# Patient Record
Sex: Female | Born: 1995 | Race: White | Hispanic: No | Marital: Married | Smoking: Never smoker
Health system: Southern US, Community
[De-identification: ages and names within clinical notes are randomized; demographics above are authoritative.]

## PROBLEM LIST (undated history)

## (undated) DIAGNOSIS — F605 Obsessive-compulsive personality disorder: Secondary | ICD-10-CM

## (undated) DIAGNOSIS — F32A Depression, unspecified: Secondary | ICD-10-CM

## (undated) DIAGNOSIS — M797 Fibromyalgia: Secondary | ICD-10-CM

## (undated) DIAGNOSIS — K219 Gastro-esophageal reflux disease without esophagitis: Secondary | ICD-10-CM

## (undated) DIAGNOSIS — F419 Anxiety disorder, unspecified: Secondary | ICD-10-CM

## (undated) DIAGNOSIS — F429 Obsessive-compulsive disorder, unspecified: Secondary | ICD-10-CM

## (undated) HISTORY — DX: Gastro-esophageal reflux disease without esophagitis: K21.9

## (undated) HISTORY — DX: Fibromyalgia: M79.7

## (undated) HISTORY — PX: FOOT BONE EXCISION: SUR493

---
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
## 2016-03-18 DIAGNOSIS — B309 Viral conjunctivitis, unspecified: Secondary | ICD-10-CM | POA: Insufficient documentation

## 2021-09-11 ENCOUNTER — Ambulatory Visit: Payer: Self-pay | Admitting: Family

## 2021-10-05 ENCOUNTER — Ambulatory Visit: Payer: 59 | Admitting: Family

## 2021-10-05 DIAGNOSIS — R102 Pelvic and perineal pain: Secondary | ICD-10-CM | POA: Diagnosis not present

## 2021-10-05 DIAGNOSIS — N979 Female infertility, unspecified: Secondary | ICD-10-CM | POA: Diagnosis not present

## 2021-10-05 DIAGNOSIS — N946 Dysmenorrhea, unspecified: Secondary | ICD-10-CM | POA: Diagnosis not present

## 2021-10-05 DIAGNOSIS — N941 Unspecified dyspareunia: Secondary | ICD-10-CM | POA: Diagnosis not present

## 2021-11-03 DIAGNOSIS — Z6835 Body mass index (BMI) 35.0-35.9, adult: Secondary | ICD-10-CM | POA: Diagnosis not present

## 2021-11-03 DIAGNOSIS — M549 Dorsalgia, unspecified: Secondary | ICD-10-CM | POA: Diagnosis not present

## 2021-11-04 ENCOUNTER — Ambulatory Visit: Payer: 59 | Admitting: Family

## 2021-11-06 DIAGNOSIS — M546 Pain in thoracic spine: Secondary | ICD-10-CM | POA: Diagnosis not present

## 2021-11-06 DIAGNOSIS — G8929 Other chronic pain: Secondary | ICD-10-CM | POA: Diagnosis not present

## 2021-11-06 DIAGNOSIS — R69 Illness, unspecified: Secondary | ICD-10-CM | POA: Diagnosis not present

## 2021-11-06 DIAGNOSIS — R0781 Pleurodynia: Secondary | ICD-10-CM | POA: Diagnosis not present

## 2021-11-10 ENCOUNTER — Other Ambulatory Visit: Payer: Self-pay | Admitting: Internal Medicine

## 2021-11-10 ENCOUNTER — Ambulatory Visit
Admission: RE | Admit: 2021-11-10 | Discharge: 2021-11-10 | Disposition: A | Discharge status: Home / Self Care | Payer: 59 | Source: Ambulatory Visit | Attending: Internal Medicine | Admitting: Internal Medicine

## 2021-11-10 DIAGNOSIS — R0781 Pleurodynia: Secondary | ICD-10-CM

## 2021-11-10 DIAGNOSIS — R059 Cough, unspecified: Secondary | ICD-10-CM | POA: Diagnosis not present

## 2021-11-10 DIAGNOSIS — R079 Chest pain, unspecified: Secondary | ICD-10-CM | POA: Diagnosis not present

## 2021-11-16 ENCOUNTER — Other Ambulatory Visit: Payer: Self-pay | Admitting: Obstetrics and Gynecology

## 2021-11-16 DIAGNOSIS — N979 Female infertility, unspecified: Secondary | ICD-10-CM

## 2021-11-17 ENCOUNTER — Emergency Department (HOSPITAL_COMMUNITY): Payer: 59

## 2021-11-17 ENCOUNTER — Encounter (HOSPITAL_COMMUNITY): Payer: Self-pay

## 2021-11-17 ENCOUNTER — Emergency Department (HOSPITAL_COMMUNITY)
Admission: EM | Admit: 2021-11-17 | Discharge: 2021-11-17 | Disposition: A | Discharge status: Home / Self Care | Payer: 59 | Attending: Emergency Medicine | Admitting: Emergency Medicine

## 2021-11-17 ENCOUNTER — Other Ambulatory Visit: Payer: Self-pay

## 2021-11-17 DIAGNOSIS — Z20822 Contact with and (suspected) exposure to covid-19: Secondary | ICD-10-CM | POA: Diagnosis not present

## 2021-11-17 DIAGNOSIS — R1012 Left upper quadrant pain: Secondary | ICD-10-CM | POA: Insufficient documentation

## 2021-11-17 DIAGNOSIS — D72829 Elevated white blood cell count, unspecified: Secondary | ICD-10-CM | POA: Insufficient documentation

## 2021-11-17 DIAGNOSIS — S2232XA Fracture of one rib, left side, initial encounter for closed fracture: Secondary | ICD-10-CM | POA: Diagnosis not present

## 2021-11-17 DIAGNOSIS — X58XXXA Exposure to other specified factors, initial encounter: Secondary | ICD-10-CM | POA: Diagnosis not present

## 2021-11-17 DIAGNOSIS — S29001A Unspecified injury of muscle and tendon of front wall of thorax, initial encounter: Secondary | ICD-10-CM | POA: Diagnosis not present

## 2021-11-17 DIAGNOSIS — R0781 Pleurodynia: Secondary | ICD-10-CM | POA: Diagnosis not present

## 2021-11-17 DIAGNOSIS — R059 Cough, unspecified: Secondary | ICD-10-CM | POA: Diagnosis not present

## 2021-11-17 DIAGNOSIS — R0602 Shortness of breath: Secondary | ICD-10-CM | POA: Diagnosis not present

## 2021-11-17 DIAGNOSIS — R079 Chest pain, unspecified: Secondary | ICD-10-CM | POA: Diagnosis not present

## 2021-11-17 DIAGNOSIS — R109 Unspecified abdominal pain: Secondary | ICD-10-CM | POA: Diagnosis not present

## 2021-11-17 DIAGNOSIS — R0789 Other chest pain: Secondary | ICD-10-CM | POA: Diagnosis not present

## 2021-11-17 DIAGNOSIS — R000 Tachycardia, unspecified: Secondary | ICD-10-CM | POA: Diagnosis not present

## 2021-11-17 HISTORY — DX: Anxiety disorder, unspecified: F41.9

## 2021-11-17 HISTORY — DX: Depression, unspecified: F32.A

## 2021-11-17 HISTORY — DX: Obsessive-compulsive disorder, unspecified: F42.9

## 2021-11-17 HISTORY — DX: Obsessive-compulsive personality disorder: F60.5

## 2021-11-17 LAB — CBC
HCT: 43.6 % (ref 36.0–46.0)
Hemoglobin: 15.6 g/dL — ABNORMAL HIGH (ref 12.0–15.0)
MCH: 32.7 pg (ref 26.0–34.0)
MCHC: 35.8 g/dL (ref 30.0–36.0)
MCV: 91.4 fL (ref 80.0–100.0)
Platelets: 484 10*3/uL — ABNORMAL HIGH (ref 150–400)
RBC: 4.77 MIL/uL (ref 3.87–5.11)
RDW: 11.5 % (ref 11.5–15.5)
WBC: 15.9 10*3/uL — ABNORMAL HIGH (ref 4.0–10.5)
nRBC: 0 % (ref 0.0–0.2)

## 2021-11-17 LAB — URINALYSIS, ROUTINE W REFLEX MICROSCOPIC
Bilirubin Urine: NEGATIVE
Glucose, UA: NEGATIVE mg/dL
Ketones, ur: 5 mg/dL — AB
Leukocytes,Ua: NEGATIVE
Nitrite: NEGATIVE
Protein, ur: 30 mg/dL — AB
Specific Gravity, Urine: 1.025 (ref 1.005–1.030)
pH: 5 (ref 5.0–8.0)

## 2021-11-17 LAB — TROPONIN I (HIGH SENSITIVITY)
Troponin I (High Sensitivity): 3 ng/L (ref <18)
Troponin I (High Sensitivity): 4 ng/L (ref <18)

## 2021-11-17 LAB — LIPASE, BLOOD: Lipase: 35 U/L (ref 11–51)

## 2021-11-17 LAB — HEPATIC FUNCTION PANEL
ALT: 19 U/L (ref 0–44)
AST: 18 U/L (ref 15–41)
Albumin: 3.8 g/dL (ref 3.5–5.0)
Alkaline Phosphatase: 83 U/L (ref 38–126)
Bilirubin, Direct: <0.1 mg/dL (ref 0.0–0.2)
Total Bilirubin: 0.7 mg/dL (ref 0.3–1.2)
Total Protein: 6.6 g/dL (ref 6.5–8.1)

## 2021-11-17 LAB — I-STAT BETA HCG BLOOD, ED (MC, WL, AP ONLY): I-stat hCG, quantitative: <5 m[IU]/mL (ref <5)

## 2021-11-17 LAB — BASIC METABOLIC PANEL
Anion gap: 12 (ref 5–15)
BUN: 9 mg/dL (ref 6–20)
CO2: 22 mmol/L (ref 22–32)
Calcium: 9.1 mg/dL (ref 8.9–10.3)
Chloride: 104 mmol/L (ref 98–111)
Creatinine, Ser: 0.73 mg/dL (ref 0.44–1.00)
GFR, Estimated: >60 mL/min (ref >60)
Glucose, Bld: 134 mg/dL — ABNORMAL HIGH (ref 70–99)
Potassium: 3.7 mmol/L (ref 3.5–5.1)
Sodium: 138 mmol/L (ref 135–145)

## 2021-11-17 LAB — RESP PANEL BY RT-PCR (FLU A&B, COVID) ARPGX2
Influenza A by PCR: NEGATIVE
Influenza B by PCR: NEGATIVE
SARS Coronavirus 2 by RT PCR: NEGATIVE

## 2021-11-17 MED ORDER — MORPHINE SULFATE (PF) 4 MG/ML IV SOLN
4.0000 mg | Freq: Once | INTRAVENOUS | Status: AC
Start: 2021-11-17 — End: 2021-11-17
  Administered 2021-11-17: 4 mg via INTRAVENOUS
  Filled 2021-11-17: qty 1

## 2021-11-17 MED ORDER — OXYCODONE-ACETAMINOPHEN 5-325 MG PO TABS
1.0000 | ORAL_TABLET | Freq: Once | ORAL | Status: AC
  Administered 2021-11-17: 1 via ORAL
  Filled 2021-11-17: qty 1

## 2021-11-17 MED ORDER — IOHEXOL 350 MG/ML SOLN
100.0000 mL | Freq: Once | INTRAVENOUS | Status: AC | PRN
  Administered 2021-11-17: 100 mL via INTRAVENOUS

## 2021-11-17 MED ORDER — OXYCODONE-ACETAMINOPHEN 5-325 MG PO TABS: 1.0000 | ORAL_TABLET | ORAL | 0 refills | Status: DC | PRN

## 2021-11-17 MED ORDER — LIDOCAINE 5 % EX PTCH: 1.0000 | MEDICATED_PATCH | CUTANEOUS | 0 refills | Status: DC

## 2021-11-17 NOTE — ED Provider Triage Note (Signed)
Emergency Medicine Provider Triage Evaluation Note ? ?Jessica Mcclain , a 26 y.o. female  was evaluated in triage.  Pt complains of left rib pain x 1 month, worsened suddenly with popping sensation while coughing. SOB, pain with deep inspiration.  ? ?Review of Systems  ?Positive: Rib pain shortness of breath urinary frequency and dysuria ?Negative: Chest pain, nausea, vomiting ? ?Physical Exam  ?BP (!) 174/117 (BP Location: Right Arm)   Pulse (!) 143   Temp 99 ?F (37.2 ?C) (Oral)   Resp (!) 24   Ht 5\' 7"  (1.702 m)   Wt 90.4 kg   SpO2 96%   BMI 31.21 kg/m?  ?Gen:   Awake, no distress   ?Resp:  Normal effort  ?MSK:   Moves extremities without difficulty  ?Other:  Cardiopulmonary exam revealed tachycardia to 108 heart rate with regular rhythm.  Pulmonary exam is unremarkable.  Patient does appear somewhat anxious.  Neurovascular intact in all 4 extremities.  Tenderness palpation over the left ribs. ? ?Medical Decision Making  ?Medically screening exam initiated at 5:10 PM.  Appropriate orders placed.  Jessica Mcclain was informed that the remainder of the evaluation will be completed by another provider, this initial triage assessment does not replace that evaluation, and the importance of remaining in the ED until their evaluation is complete. ? ?This chart was dictated using voice recognition software, Dragon. Despite the best efforts of this provider to proofread and correct errors, errors may still occur which can change documentation meaning. ? ? ?  ?Emeline Darling, PA-C ?11/17/21 1717 ? ?

## 2021-11-17 NOTE — ED Triage Notes (Signed)
Pt arrived via POV for rib pain on left [id number]. Pt states she felt a popping on her left rib area and is now having SOB about 30 mins ago. Pt is tachypneic, tachycardic. Pt is dyspneic. ?

## 2021-11-17 NOTE — Discharge Instructions (Signed)
Your history, exam, work-up today revealed a single left-sided rib fracture causing your symptoms.  There is no evidence of collapsed lung, pneumonia, or blood clot on the imaging.  Your heart enzymes were reassuring.  We feel you are safe for discharge home.  Please use the incentive spirometer as we discussed and use the pain medicine and Lidoderm patches to work with your muscle relaxant.  Please follow-up with your primary doctor and rest and stay hydrated.  If any symptoms change or worsen acutely, please return to the nearest emergency department. ?

## 2021-11-17 NOTE — ED Provider Notes (Signed)
[facility] EMERGENCY DEPARTMENT Provider Note   CSN: [id number] Arrival date & time: 11/17/21  1652     History  Chief Complaint  Patient presents with   Chest Pain    Jessica Mcclain is a 26 y.o. female.  The history is provided by the patient and medical records. No language interpreter was used.  Chest Pain Pain location:  L lateral chest Pain quality: sharp   Radiates to: back, LUQ. Pain severity:  Severe Onset quality:  Sudden Duration:  4 hours Timing:  Constant Progression:  Waxing and waning Chronicity:  Recurrent Context: breathing   Worsened by:  Coughing and deep breathing Ineffective treatments: muscle relasant. Associated symptoms: abdominal pain, back pain, cough and shortness of breath   Associated symptoms: no altered mental status, no diaphoresis, no fatigue, no fever, no headache, no lower extremity edema, no nausea, no near-syncope, no numbness, no palpitations, no syncope, no vomiting and no weakness   Risk factors: not pregnant and no prior DVT/PE       Home Medications Prior to Admission medications   Medication Sig Start Date End Date Taking? Authorizing Provider  Levonorgestrel-Ethinyl Estradiol (CAMRESE LO) 0.1-0.02 & 0.01 MG tablet Take 1 tablet by mouth at bedtime.    [provider]      Allergies    Patient has no known allergies.    Review of Systems   Review of Systems  Constitutional:  Negative for chills, diaphoresis, fatigue and fever.  HENT:  Negative for congestion.   Eyes:  Negative for visual disturbance.  Respiratory:  Positive for cough and shortness of breath. Negative for chest tightness and wheezing.   Cardiovascular:  Positive for chest pain. Negative for palpitations, leg swelling, syncope and near-syncope.  Gastrointestinal:  Positive for abdominal pain. Negative for constipation, diarrhea, nausea and vomiting.  Genitourinary:  Negative for dysuria.  Musculoskeletal:  Positive for  back pain. Negative for neck pain and neck stiffness.  Skin:  Negative for rash and wound.  Neurological:  Negative for weakness, light-headedness, numbness and headaches.  Psychiatric/Behavioral:  Negative for agitation and confusion.   All other systems reviewed and are negative.  Physical Exam Updated Vital Signs BP (!) 141/85 (BP Location: Right Arm)   Pulse (!) 122   Temp 99 F (37.2 C) (Oral)   Resp 20   Ht 5\' 7"  (1.702 m)   Wt 90.4 kg   SpO2 100%   BMI 31.21 kg/m  Physical Exam Vitals and nursing note reviewed.  Constitutional:      General: She is not in acute distress.    Appearance: She is well-developed. She is not ill-appearing, toxic-appearing or diaphoretic.  HENT:     Head: Normocephalic and atraumatic.  Eyes:     Conjunctiva/sclera: Conjunctivae normal.  Cardiovascular:     Rate and Rhythm: Regular rhythm. Tachycardia present.     Heart sounds: Normal heart sounds. No murmur heard. Pulmonary:     Effort: Pulmonary effort is normal. Tachypnea present. No respiratory distress.     Breath sounds: Normal breath sounds. No decreased breath sounds, wheezing, rhonchi or rales.  Chest:     Chest wall: Tenderness present.  Abdominal:     Palpations: Abdomen is soft.     Tenderness: There is abdominal tenderness. There is no guarding or rebound.  Musculoskeletal:        General: No swelling.     Cervical back: Neck supple.     Right lower leg: No tenderness. No edema.  Left lower leg: No tenderness. No edema.  Skin:    General: Skin is warm and dry.     Capillary Refill: Capillary refill takes less than 2 seconds.  Neurological:     General: No focal deficit present.     Mental Status: She is alert.  Psychiatric:        Mood and Affect: Mood normal.    ED Results / Procedures / Treatments   Labs (all labs ordered are listed, but only abnormal results are displayed) Labs Reviewed  BASIC METABOLIC PANEL - Abnormal; Notable for the following components:       Result Value   Glucose, Bld 134 (*)    All other components within normal limits  CBC - Abnormal; Notable for the following components:   WBC 15.9 (*)    Hemoglobin 15.6 (*)    Platelets 484 (*)    All other components within normal limits  URINALYSIS, ROUTINE W REFLEX MICROSCOPIC - Abnormal; Notable for the following components:   Color, Urine AMBER (*)    APPearance HAZY (*)    Hgb urine dipstick LARGE (*)    Ketones, ur 5 (*)    Protein, ur 30 (*)    Bacteria, UA RARE (*)    All other components within normal limits  RESP PANEL BY RT-PCR (FLU A&B, COVID) ARPGX2  LIPASE, BLOOD  HEPATIC FUNCTION PANEL  I-STAT BETA HCG BLOOD, ED ([facility], [facility], [facility] ONLY)  TROPONIN I (HIGH SENSITIVITY)  TROPONIN I (HIGH SENSITIVITY)    EKG None  Radiology DG Ribs Unilateral W/Chest Left  Result Date: 11/17/2021 CLINICAL DATA:  Shortness of breath with left rib pain. EXAM: LEFT RIBS AND CHEST - 3+ VIEW COMPARISON:  Chest x-ray 11/10/2021. FINDINGS: No fracture or other bone lesions are seen involving the ribs. There is no evidence of pneumothorax or pleural effusion. Both lungs are clear. Heart size and mediastinal contours are within normal limits. IMPRESSION: Negative. Electronically Signed   By: Darliss Cheney M.D.   On: 11/17/2021 17:46   CT Angio Chest PE W and/or Wo Contrast  Result Date: 11/17/2021 CLINICAL DATA:  Left chest, back, and upper abdominal pain. Tachycardia. Shortness of breath. EXAM: CT ANGIOGRAPHY CHEST WITH CONTRAST TECHNIQUE: Multidetector CT imaging of the chest was performed using the standard protocol during bolus administration of intravenous contrast. Multiplanar CT image reconstructions and MIPs were obtained to evaluate the vascular anatomy. RADIATION DOSE REDUCTION: This exam was performed according to the departmental dose-optimization program which includes automated exposure control, adjustment of the mA and/or kV according to patient size and/or use of iterative  reconstruction technique. CONTRAST:  OMNIPAQUE IOHEXOL 350 MG/ML SOLN COMPARISON:  11/17/2021 FINDINGS: Cardiovascular: This is a technically adequate evaluation of the pulmonary vasculature. No filling defects or pulmonary emboli. The heart is unremarkable without pericardial effusion. No evidence of thoracic aortic aneurysm or dissection. Mediastinum/Nodes: No enlarged mediastinal, hilar, or axillary lymph nodes. Thyroid gland, trachea, and esophagus demonstrate no significant findings. Lungs/Pleura: No acute airspace disease, effusion, or pneumothorax. Central airways are widely patent. Upper Abdomen: No acute abnormality. Musculoskeletal: No acute or destructive bony lesions. Reconstructed images demonstrate no additional findings. Review of the MIP images confirms the above findings. IMPRESSION: 1. No evidence of pulmonary embolus. 2. No acute intrathoracic process. Electronically Signed   By: Sharlet Salina M.D.   On: 11/17/2021 21:12   CT ABDOMEN PELVIS W CONTRAST  Result Date: 11/17/2021 CLINICAL DATA:  Left upper quadrant pain, tachycardia, shortness of breath EXAM: CT  ABDOMEN AND PELVIS WITH CONTRAST TECHNIQUE: Multidetector CT imaging of the abdomen and pelvis was performed using the standard protocol following bolus administration of intravenous contrast. RADIATION DOSE REDUCTION: This exam was performed according to the departmental dose-optimization program which includes automated exposure control, adjustment of the mA and/or kV according to patient size and/or use of iterative reconstruction technique. CONTRAST:  OMNIPAQUE IOHEXOL 350 MG/ML SOLN COMPARISON:  None. FINDINGS: Lower chest: No acute pleural or parenchymal lung disease. Hepatobiliary: No focal liver abnormality is seen. No gallstones, gallbladder wall thickening, or biliary dilatation. Pancreas: Unremarkable. No pancreatic ductal dilatation or surrounding inflammatory changes. Spleen: Normal in size without focal  abnormality. Adrenals/Urinary Tract: Adrenal glands are unremarkable. Kidneys are normal, without renal calculi, focal lesion, or hydronephrosis. Bladder is unremarkable. Stomach/Bowel: No bowel obstruction or ileus. Normal appendix right lower quadrant. No bowel wall thickening or inflammatory change. Vascular/Lymphatic: No significant vascular findings are present. No enlarged abdominal or pelvic lymph nodes. Reproductive: Uterus and bilateral adnexa are unremarkable. Other: No free fluid or free intraperitoneal gas. No abdominal wall hernia. Musculoskeletal: There is a nondisplaced left posterior ninth rib fracture. No other acute bony abnormalities. Reconstructed images demonstrate no additional findings. IMPRESSION: 1. Nondisplaced left posterior ninth rib fracture. 2. Otherwise no acute intra-abdominal or intrapelvic process. Electronically Signed   By: Sharlet Salina M.D.   On: 11/17/2021 21:16    Procedures Procedures    Medications Ordered in ED Medications  oxyCODONE-acetaminophen (PERCOCET/ROXICET) 5-325 MG per tablet 1 tablet (has no administration in time range)  morphine (PF) 4 MG/ML injection 4 mg (4 mg Intravenous Given 11/17/21 2002)  iohexol (OMNIPAQUE) 350 MG/ML injection 100 mL (100 mLs Intravenous Contrast Given 11/17/21 2106)    ED Course/ Medical Decision Making/ A&P                           Medical Decision Making Amount and/or Complexity of Data Reviewed Labs: ordered. Radiology: ordered.  Risk Prescription drug management.    Jessica Mcclain is a 26 y.o. female with a past medical history significant for anxiety and depression who presents with worsened left-sided pleuritic chest pain and shortness of breath.  According to patient, for the last 2 months she has had 2 previous episodes of left-sided chest pain that was severe.  She reports it has gone away quickly but today started and has been persistent.  She reports she was started on muscle relaxants by PCP  and a virtual provider and has had no success with it.  She said it started again today when she coughed and felt a pop and then had extreme pain in her left lateral chest, back, and her left upper quadrant.  She reports no nausea, vomiting, constipation, or diarrhea.  She reports she is on her menstrual cycle but denies vaginal or urinary symptoms.  Denies any trauma.  Denies rashes to suggest shingles.  She says that the pain was severe and when she arrived had a heart rate in the 140s.  On my examination heart rate is in the 1 10-1 20 range.  She is tachypneic as well but is maintaining oxygen saturations.  She denies any leg pain or leg swelling and denies any history of DVT or PE.  She does have a family history of lung cancer but she does not smoke.  On exam, lungs were clear but the left lateral chest and left back was very tender to palpation.  No rash seen.  Patient also tenderness on her left flank and left upper abdomen.  Bowel sounds were appreciated.  No leg tenderness or leg swelling.  Exam otherwise unremarkable.  EKG did not show STEMI but showed sinus tachycardia.  Clinically I am concerned that we need to rule out a thromboembolic cause of symptoms.  Her x-ray was reassuring but given the amount of pain and significant concern for abnormality, will get CT PE study as well as a CT abdomen pelvis to look for acute abnormalities.  We will also get lipase, hepatic function to add to the other labs that were initially reassuring.  She does have a leukocytosis but otherwise kidney function was normal.  Initial troponin negative, will trend.  If work-up is complete reassuring, suspect this is an atypical muscle pain however patient agrees with work-up including imaging to look for more nefarious causes of the left sided torso pain today.      10:55 PM CT scan returned showing evidence of left ninth rib fracture.  This fits with the patient story of coughing causing a pop and severe pain.  We  will give a Percocet as the narcotics seem to help her symptoms.  We will give incentive spirometer.  Patient otherwise had reassuring work-up with reassuring troponins and no evidence of PE on imaging.  No evidence of pneumonia or pneumothorax.  Patient will follow with PCP and will give prescription for Lidoderm patches and Percocet.  She will continue the Flexeril which she has at home as needed and she will get incentive spirometer to use as well to prevent pneumonia.  Patient agreed with plan of care and had no other questions or concerns and was discharged in good condition.         Final Clinical Impression(s) / ED Diagnoses Final diagnoses:  Closed fracture of one rib of left side, initial encounter  Pleuritic chest pain    Rx / DC Orders ED Discharge Orders          Ordered    oxyCODONE-acetaminophen (PERCOCET/ROXICET) 5-325 MG tablet  Every 4 hours PRN        11/17/21 2258    lidocaine (LIDODERM) 5 %  Every 24 hours        11/17/21 2258            Clinical Impression: 1. Closed fracture of one rib of left side, initial encounter   2. Pleuritic chest pain     Disposition: Discharge  Condition: Good  I have discussed the results, Dx and Tx plan with the pt(& family if present). He/she/they expressed understanding and agree(s) with the plan. Discharge instructions discussed at great length. Strict return precautions discussed and pt &/or family have verbalized understanding of the instructions. No further questions at time of discharge.    New Prescriptions   LIDOCAINE (LIDODERM) 5 %    Place 1 patch onto the skin daily. Remove & Discard patch within 12 hours or as directed by MD   OXYCODONE-ACETAMINOPHEN (PERCOCET/ROXICET) 5-325 MG TABLET    Take 1 tablet by mouth every 4 (four) hours as needed for severe pain.    Follow Up: No follow-up provider specified.     Treyshon Buchanon, Canary Brim, MD 11/17/21 [phone]

## 2021-11-23 ENCOUNTER — Other Ambulatory Visit: Payer: Self-pay | Admitting: Internal Medicine

## 2021-11-23 ENCOUNTER — Ambulatory Visit
Admission: RE | Admit: 2021-11-23 | Discharge: 2021-11-23 | Disposition: A | Discharge status: Home / Self Care | Payer: 59 | Source: Ambulatory Visit | Attending: Internal Medicine | Admitting: Internal Medicine

## 2021-11-23 ENCOUNTER — Ambulatory Visit
Admission: RE | Admit: 2021-11-23 | Discharge: 2021-11-23 | Disposition: A | Discharge status: Home / Self Care | Payer: 59 | Source: Ambulatory Visit | Attending: Obstetrics and Gynecology | Admitting: Obstetrics and Gynecology

## 2021-11-23 DIAGNOSIS — S2232XD Fracture of one rib, left side, subsequent encounter for fracture with routine healing: Secondary | ICD-10-CM | POA: Diagnosis not present

## 2021-11-23 DIAGNOSIS — R071 Chest pain on breathing: Secondary | ICD-10-CM

## 2021-11-23 DIAGNOSIS — N979 Female infertility, unspecified: Secondary | ICD-10-CM

## 2021-11-23 DIAGNOSIS — R300 Dysuria: Secondary | ICD-10-CM

## 2022-04-20 DIAGNOSIS — F411 Generalized anxiety disorder: Secondary | ICD-10-CM | POA: Diagnosis not present

## 2022-04-20 DIAGNOSIS — R69 Illness, unspecified: Secondary | ICD-10-CM | POA: Diagnosis not present

## 2022-05-05 DIAGNOSIS — R69 Illness, unspecified: Secondary | ICD-10-CM | POA: Diagnosis not present

## 2022-05-05 DIAGNOSIS — F411 Generalized anxiety disorder: Secondary | ICD-10-CM | POA: Diagnosis not present

## 2022-06-02 DIAGNOSIS — F411 Generalized anxiety disorder: Secondary | ICD-10-CM | POA: Diagnosis not present

## 2022-06-02 DIAGNOSIS — R69 Illness, unspecified: Secondary | ICD-10-CM | POA: Diagnosis not present

## 2022-06-21 DIAGNOSIS — F411 Generalized anxiety disorder: Secondary | ICD-10-CM | POA: Diagnosis not present

## 2022-06-21 DIAGNOSIS — R69 Illness, unspecified: Secondary | ICD-10-CM | POA: Diagnosis not present

## 2022-06-29 DIAGNOSIS — R69 Illness, unspecified: Secondary | ICD-10-CM | POA: Diagnosis not present

## 2022-06-29 DIAGNOSIS — F411 Generalized anxiety disorder: Secondary | ICD-10-CM | POA: Diagnosis not present

## 2022-07-08 DIAGNOSIS — R69 Illness, unspecified: Secondary | ICD-10-CM | POA: Diagnosis not present

## 2022-07-08 DIAGNOSIS — F411 Generalized anxiety disorder: Secondary | ICD-10-CM | POA: Diagnosis not present

## 2022-07-15 DIAGNOSIS — R69 Illness, unspecified: Secondary | ICD-10-CM | POA: Diagnosis not present

## 2022-07-15 DIAGNOSIS — F411 Generalized anxiety disorder: Secondary | ICD-10-CM | POA: Diagnosis not present

## 2022-07-22 DIAGNOSIS — R69 Illness, unspecified: Secondary | ICD-10-CM | POA: Diagnosis not present

## 2022-07-22 DIAGNOSIS — F411 Generalized anxiety disorder: Secondary | ICD-10-CM | POA: Diagnosis not present

## 2022-08-05 DIAGNOSIS — F411 Generalized anxiety disorder: Secondary | ICD-10-CM | POA: Diagnosis not present

## 2022-08-05 DIAGNOSIS — R69 Illness, unspecified: Secondary | ICD-10-CM | POA: Diagnosis not present

## 2022-08-19 DIAGNOSIS — F411 Generalized anxiety disorder: Secondary | ICD-10-CM | POA: Diagnosis not present

## 2022-08-19 DIAGNOSIS — R69 Illness, unspecified: Secondary | ICD-10-CM | POA: Diagnosis not present

## 2022-08-31 IMAGING — DX DG RIBS W/ CHEST 3+V*L*
3 series · 3 of 3 positions shown · non-contrast
Comparison: Chest x-ray 11/10/2021.

CLINICAL DATA: Shortness of breath with left rib pain.

EXAM:
LEFT RIBS AND CHEST - 3+ VIEW

[chest pa]
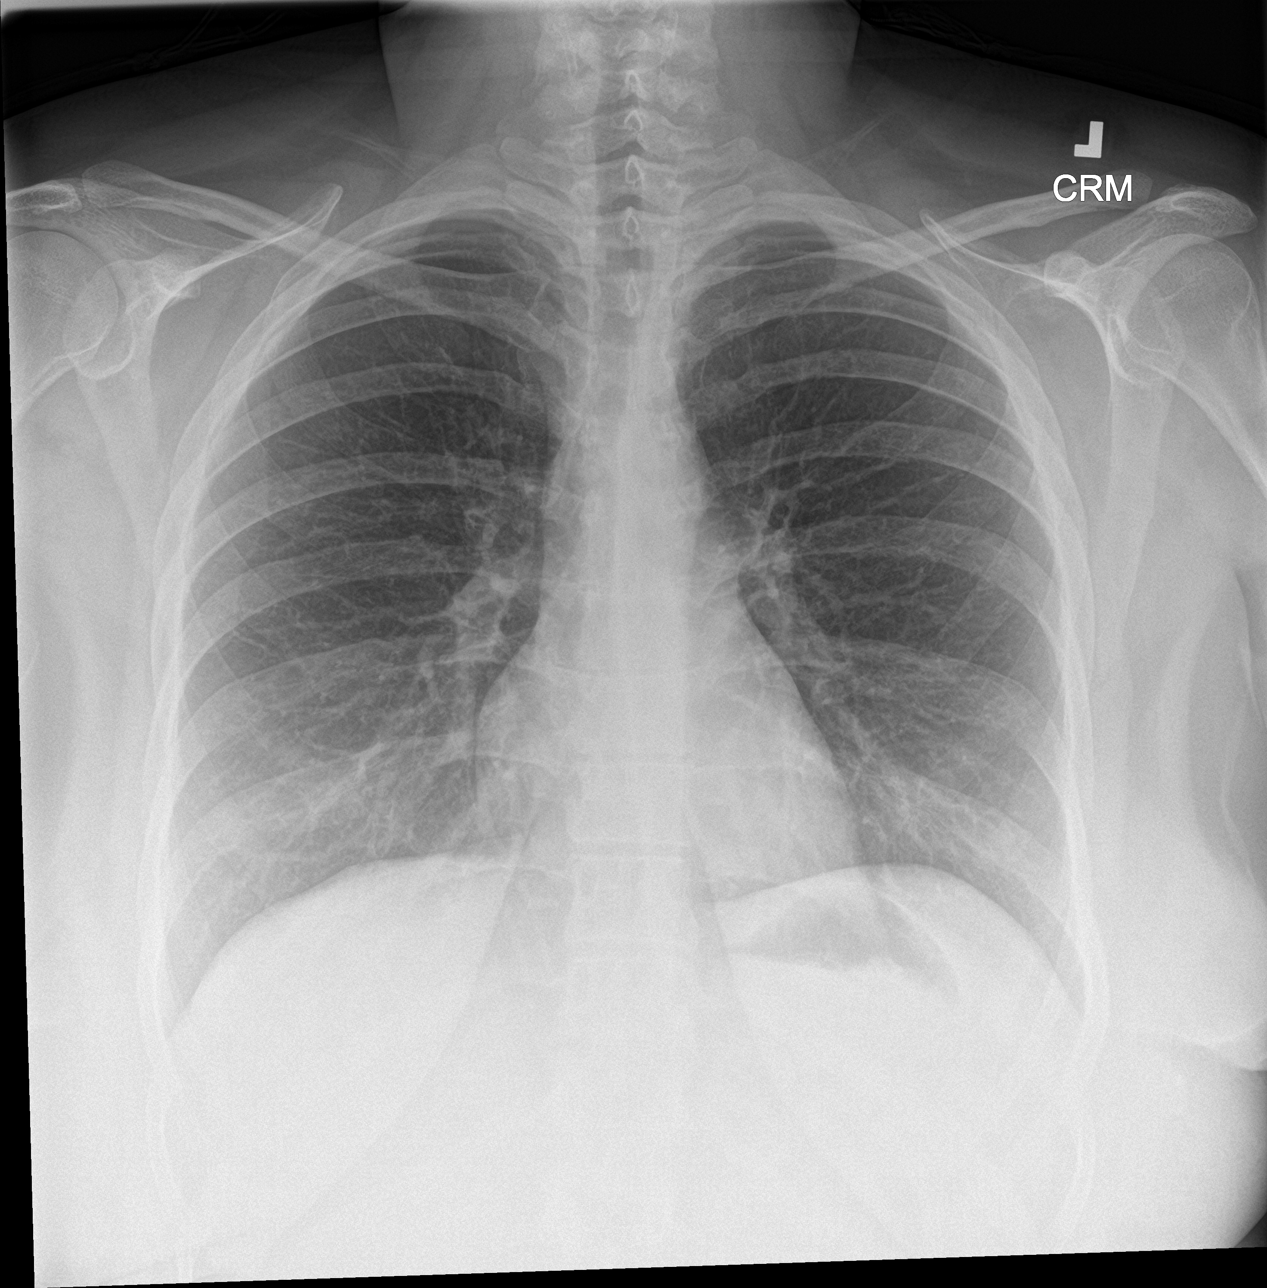

[rib pa obl]
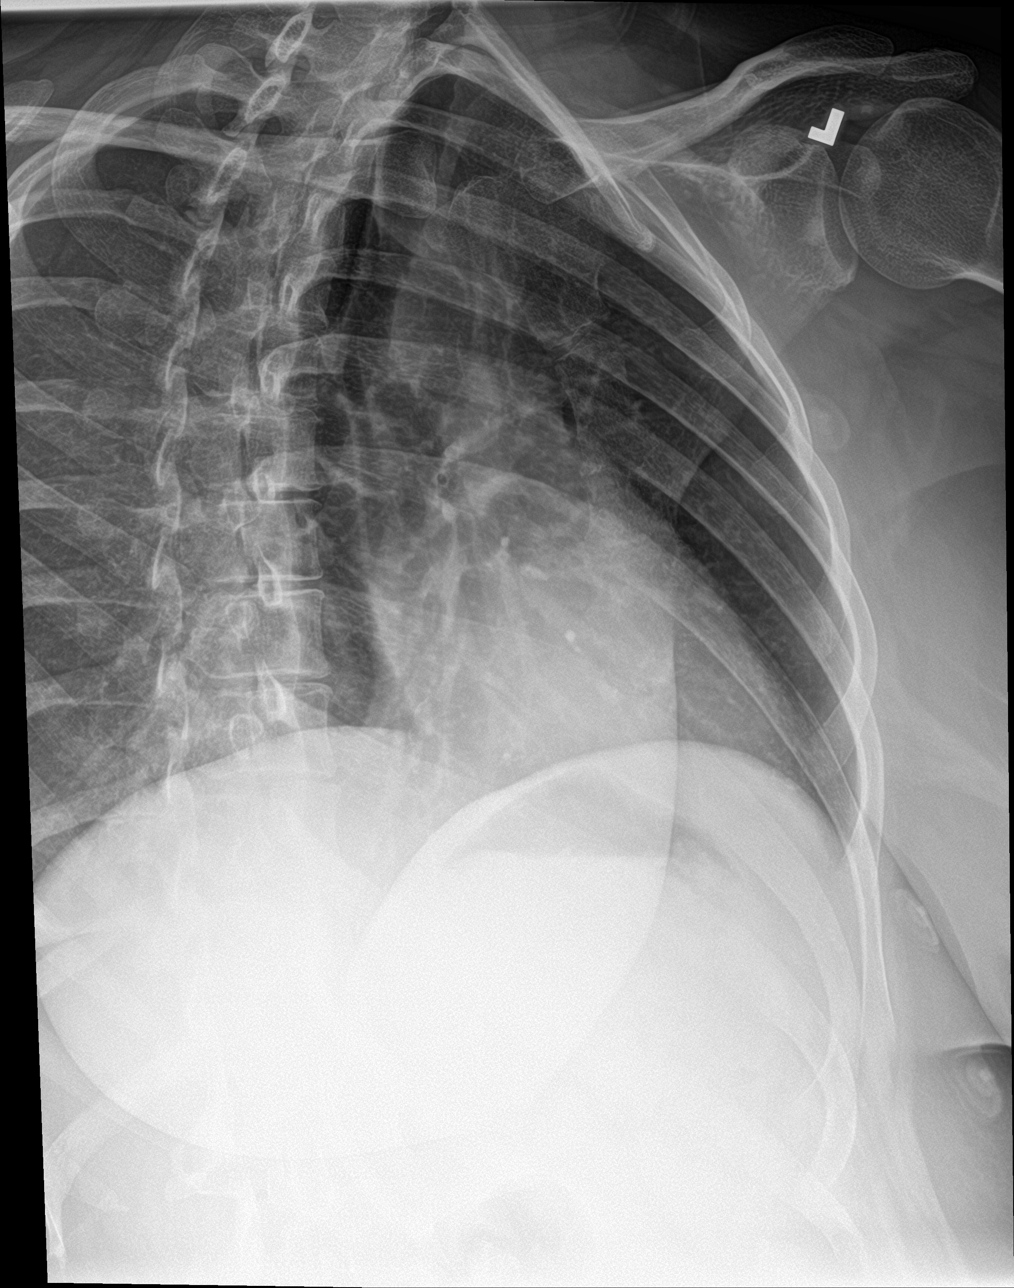

[rib pa]
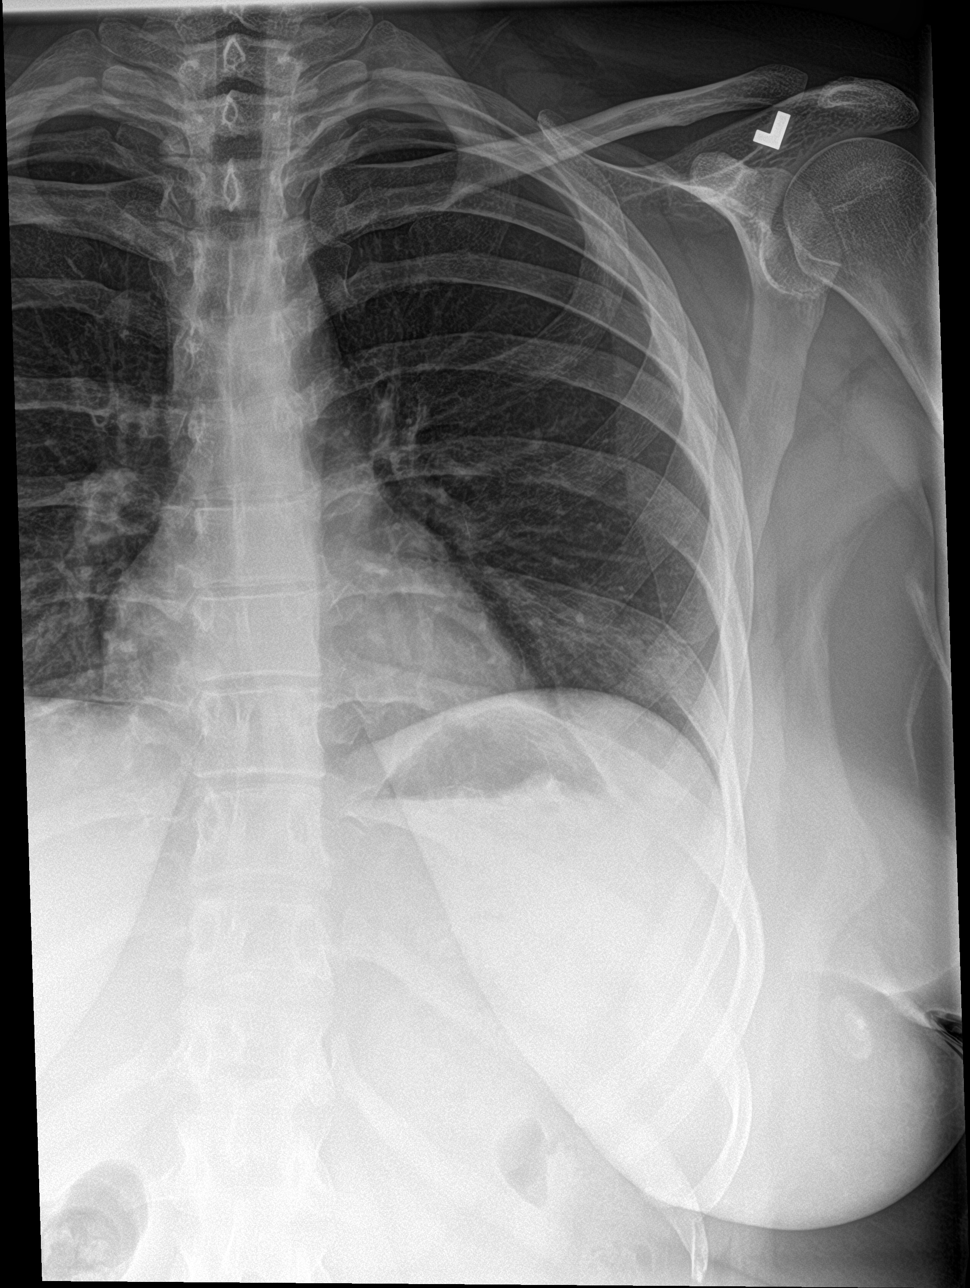

[3 of 3 positions shown; findings below may reference images not displayed]

FINDINGS: No fracture or other bone lesions are seen involving the ribs. There
is no evidence of pneumothorax or pleural effusion. Both lungs are
clear. Heart size and mediastinal contours are within normal limits.
IMPRESSION: Negative.

## 2022-09-06 IMAGING — DX DG CHEST 2V
2 series · 2 of 2 positions shown · non-contrast
Comparison: 11/10/2021

CLINICAL DATA: 25-year-old female with a history of pain when
breathing

EXAM:
CHEST - 2 VIEW

[dg chest 2 view (1 of 2)]
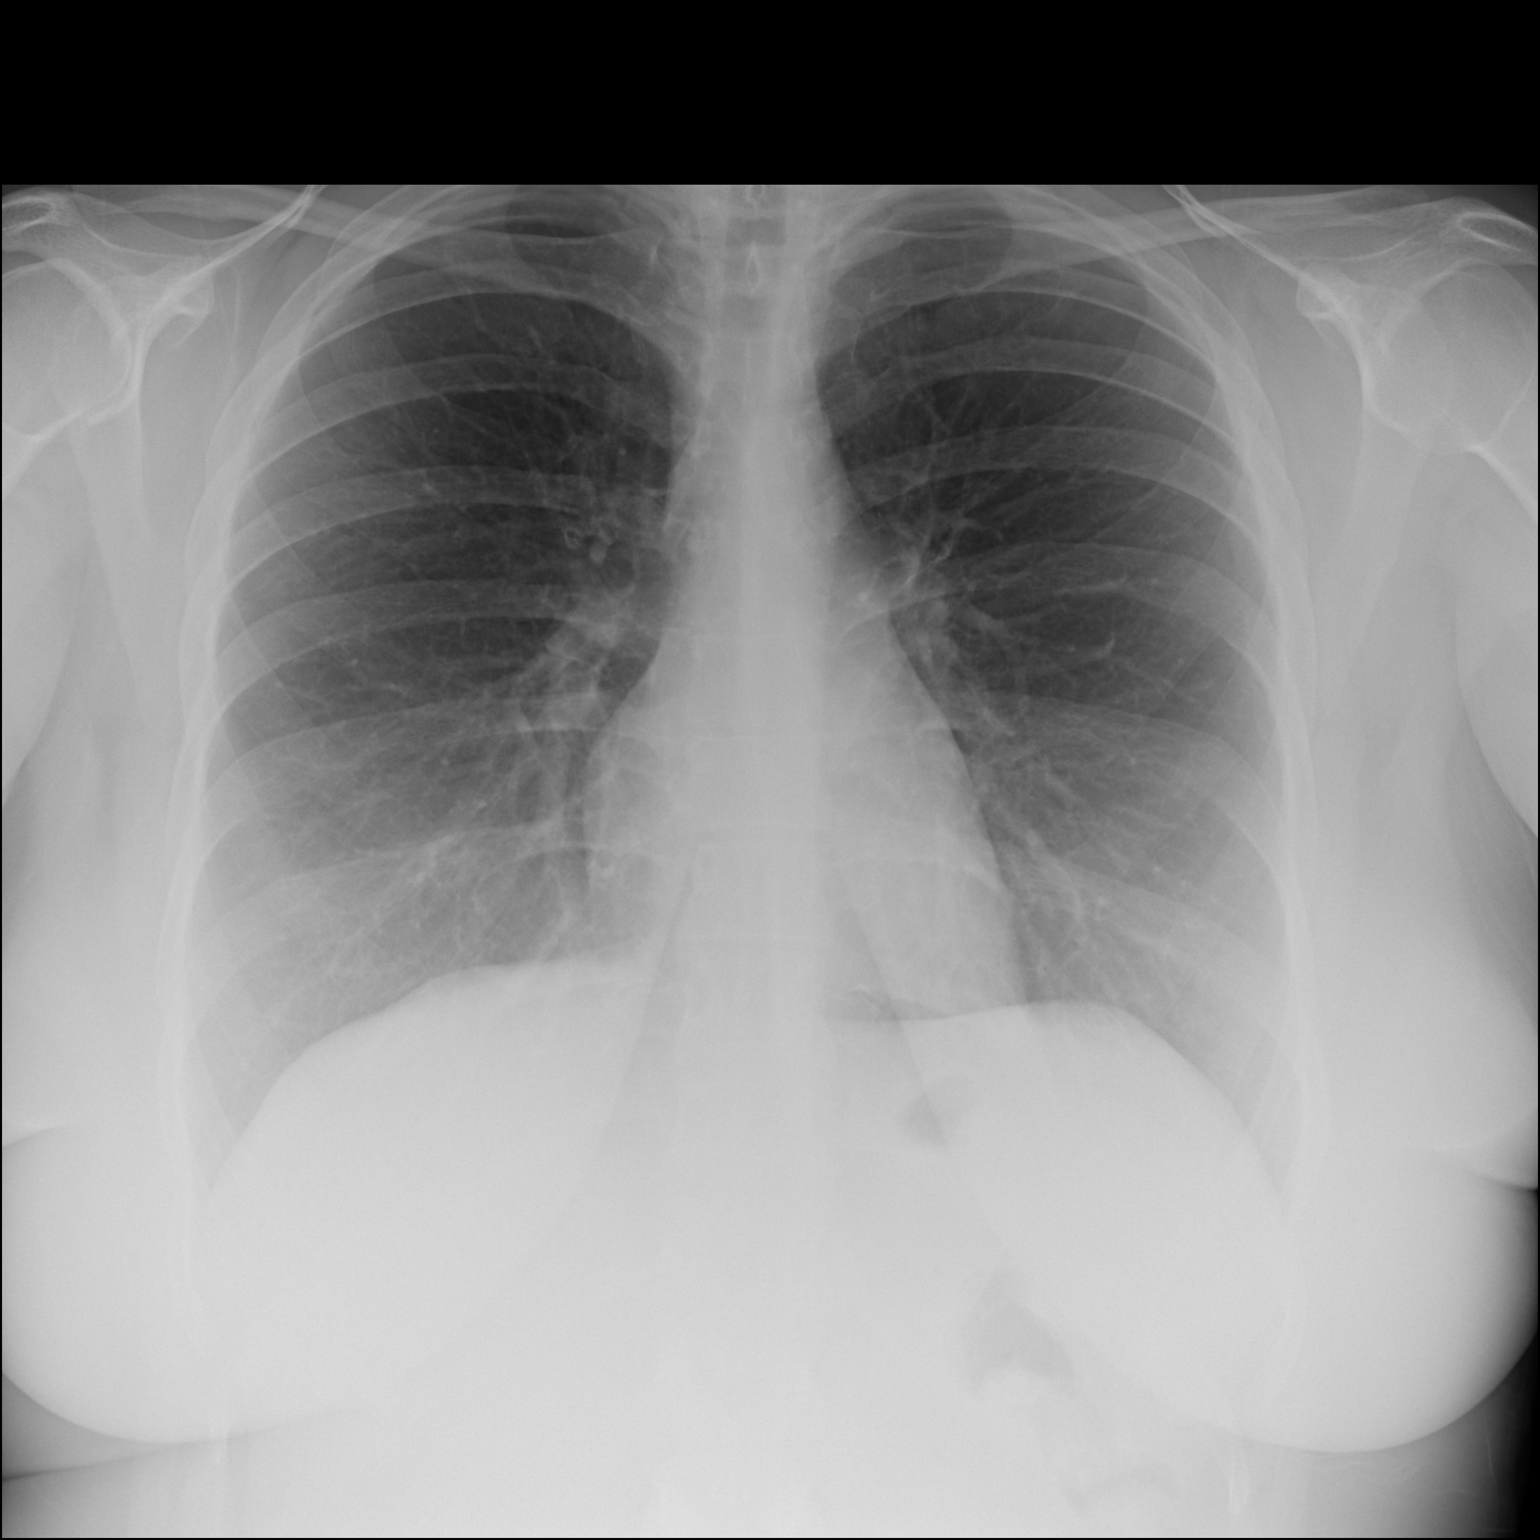

[dg chest 2 view (2 of 2)]
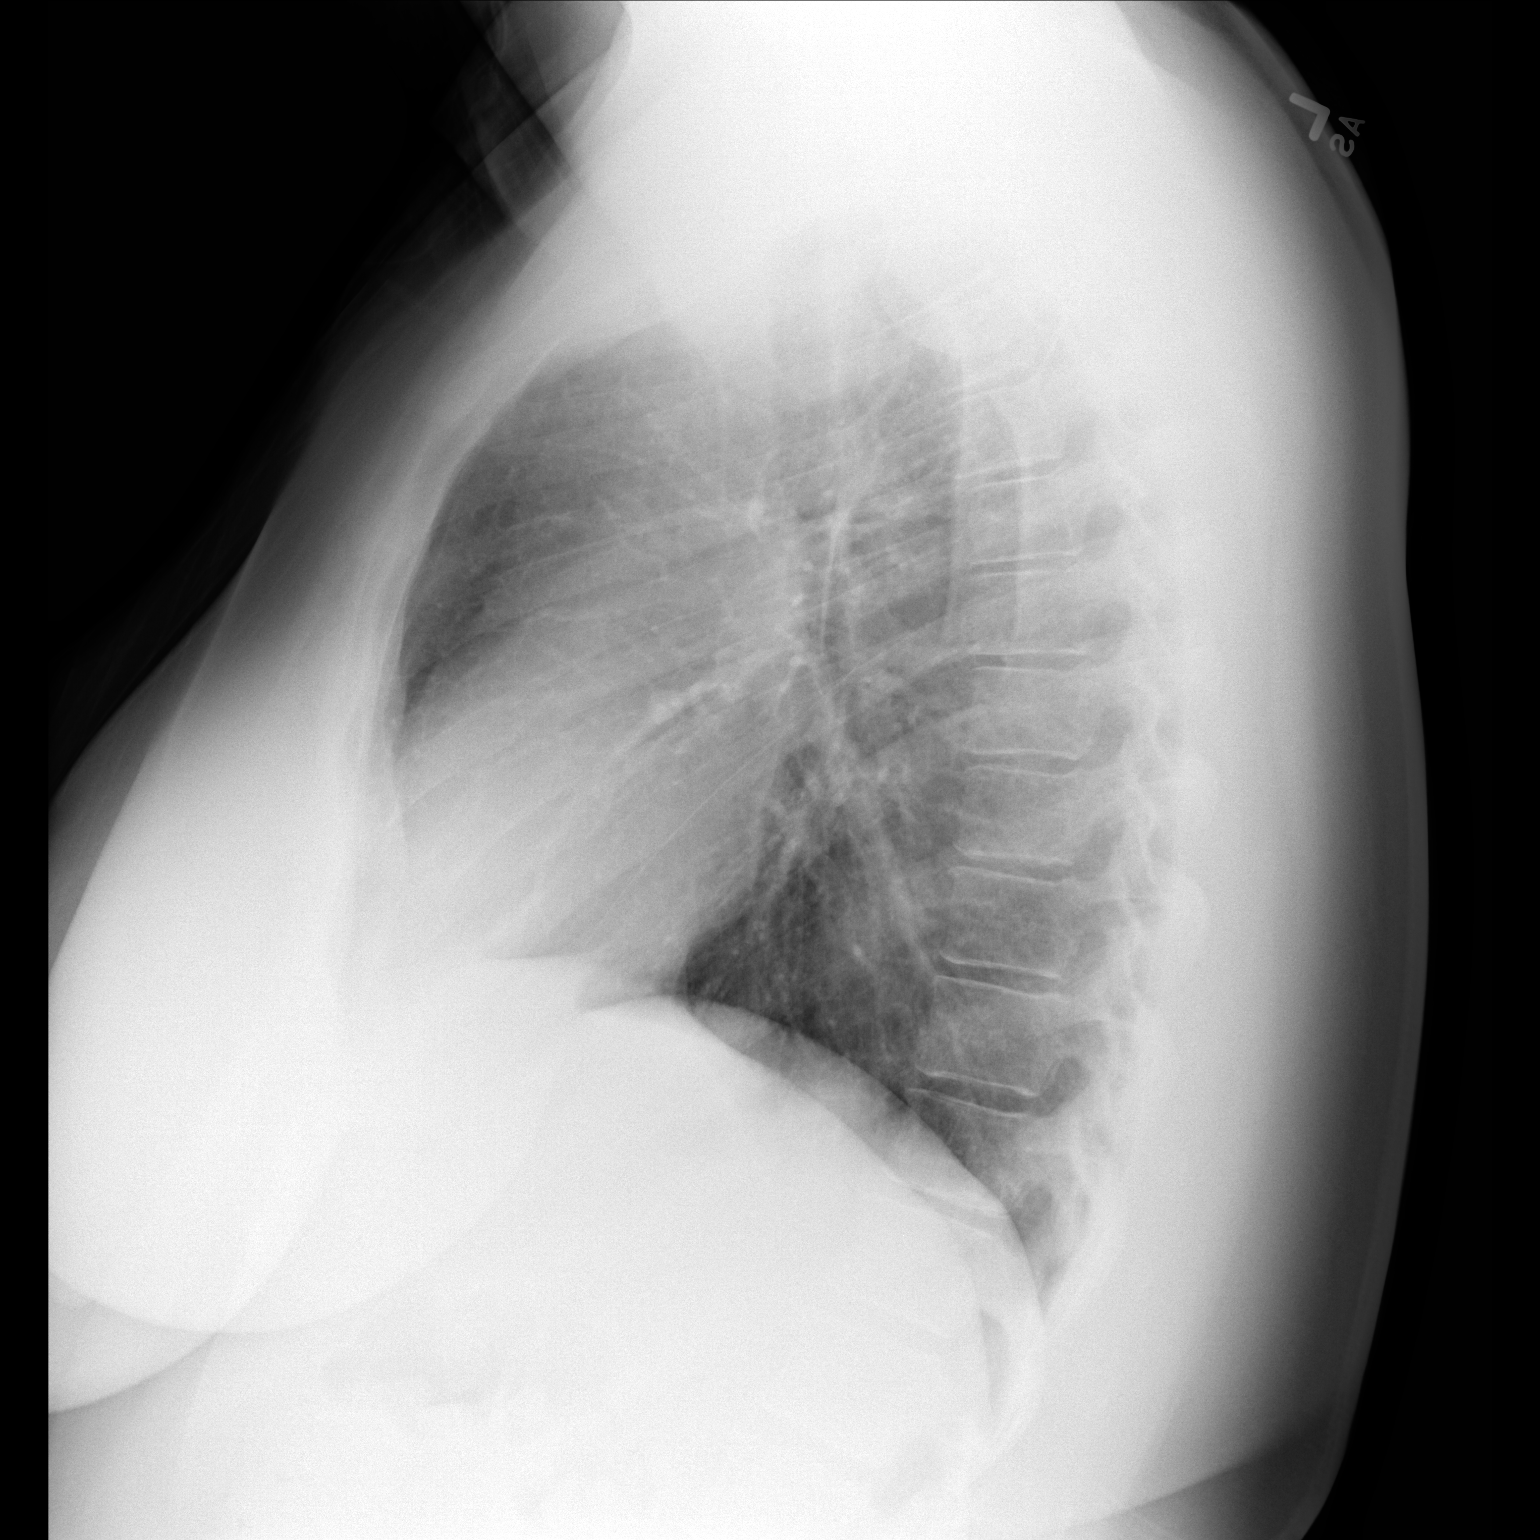

[2 of 2 positions shown; findings below may reference images not displayed]

FINDINGS: Cardiomediastinal silhouette unchanged in size and contour. No
evidence of central vascular congestion. No interlobular septal
thickening. No pneumothorax or pleural effusion. No confluent
airspace disease.

No acute displaced fracture
IMPRESSION: Negative for acute cardiopulmonary disease

## 2022-09-09 DIAGNOSIS — F411 Generalized anxiety disorder: Secondary | ICD-10-CM | POA: Diagnosis not present

## 2022-09-09 DIAGNOSIS — R69 Illness, unspecified: Secondary | ICD-10-CM | POA: Diagnosis not present

## 2022-09-23 DIAGNOSIS — R69 Illness, unspecified: Secondary | ICD-10-CM | POA: Diagnosis not present

## 2022-09-23 DIAGNOSIS — F411 Generalized anxiety disorder: Secondary | ICD-10-CM | POA: Diagnosis not present

## 2022-10-12 DIAGNOSIS — K625 Hemorrhage of anus and rectum: Secondary | ICD-10-CM | POA: Diagnosis not present

## 2022-10-14 DIAGNOSIS — F411 Generalized anxiety disorder: Secondary | ICD-10-CM | POA: Diagnosis not present

## 2022-10-14 DIAGNOSIS — R69 Illness, unspecified: Secondary | ICD-10-CM | POA: Diagnosis not present

## 2022-10-15 DIAGNOSIS — R1319 Other dysphagia: Secondary | ICD-10-CM | POA: Diagnosis not present

## 2022-10-15 DIAGNOSIS — K625 Hemorrhage of anus and rectum: Secondary | ICD-10-CM | POA: Diagnosis not present

## 2022-10-15 DIAGNOSIS — K219 Gastro-esophageal reflux disease without esophagitis: Secondary | ICD-10-CM | POA: Diagnosis not present

## 2022-10-20 DIAGNOSIS — K625 Hemorrhage of anus and rectum: Secondary | ICD-10-CM | POA: Diagnosis not present

## 2022-10-20 DIAGNOSIS — R194 Change in bowel habit: Secondary | ICD-10-CM | POA: Diagnosis not present

## 2022-10-20 DIAGNOSIS — D123 Benign neoplasm of transverse colon: Secondary | ICD-10-CM | POA: Diagnosis not present

## 2022-10-20 DIAGNOSIS — K648 Other hemorrhoids: Secondary | ICD-10-CM | POA: Diagnosis not present

## 2022-10-20 DIAGNOSIS — R1030 Lower abdominal pain, unspecified: Secondary | ICD-10-CM | POA: Diagnosis not present

## 2022-10-28 DIAGNOSIS — F411 Generalized anxiety disorder: Secondary | ICD-10-CM | POA: Diagnosis not present

## 2022-10-28 DIAGNOSIS — R69 Illness, unspecified: Secondary | ICD-10-CM | POA: Diagnosis not present

## 2022-11-25 DIAGNOSIS — F605 Obsessive-compulsive personality disorder: Secondary | ICD-10-CM | POA: Diagnosis not present

## 2022-11-25 DIAGNOSIS — F411 Generalized anxiety disorder: Secondary | ICD-10-CM | POA: Diagnosis not present

## 2022-12-16 DIAGNOSIS — F605 Obsessive-compulsive personality disorder: Secondary | ICD-10-CM | POA: Diagnosis not present

## 2022-12-16 DIAGNOSIS — F411 Generalized anxiety disorder: Secondary | ICD-10-CM | POA: Diagnosis not present

## 2022-12-20 DIAGNOSIS — F411 Generalized anxiety disorder: Secondary | ICD-10-CM | POA: Diagnosis not present

## 2022-12-20 DIAGNOSIS — F605 Obsessive-compulsive personality disorder: Secondary | ICD-10-CM | POA: Diagnosis not present

## 2023-05-04 ENCOUNTER — Encounter: Payer: Self-pay | Admitting: Physician Assistant

## 2023-05-04 ENCOUNTER — Ambulatory Visit (INDEPENDENT_AMBULATORY_CARE_PROVIDER_SITE_OTHER): Payer: Self-pay | Admitting: Physician Assistant

## 2023-05-04 ENCOUNTER — Other Ambulatory Visit (INDEPENDENT_AMBULATORY_CARE_PROVIDER_SITE_OTHER): Payer: Self-pay

## 2023-05-04 DIAGNOSIS — M546 Pain in thoracic spine: Secondary | ICD-10-CM

## 2023-05-04 NOTE — Progress Notes (Signed)
Office Visit Note   Patient: Jessica Mcclain           Date of Birth: [birth year]           MRN: [id number] Visit Date: 05/04/2023              Requested by: Jessica Mares, PA [address] [place],  [place] [address] PCP: Jessica Ates, MD   Assessment & Plan: Visit Diagnoses:  1. Acute bilateral thoracic back pain     Plan: Pleasant 27 year old woman with a long history of thoracic back pain.  This has been going on for over 10 years.  She denies any injuries.  She also has multiple joint pain as well.  Her thoracic back pain is more significant to her.  Exam she does have some tenderness within the paravertebral muscles as well as the mid lower back.  X-rays suggest the site of the spaces perhaps.  Neurologically she is intact.  I recommended physical therapy.  She can also try using some Voltaren gel.  She is down the meloxicam.  She was told at one point that she had some increased inflammatory markers though her rheumatoid factor was negative.  I do not have access to these labs.  I also recommended better support but she has difficulty with bras as they tend to make her back burn more.  When she is ready would like her to follow-up with Jessica Mcclain for further recommendations.  Follow-Up Instructions: With Jessica Mcclain for back  Orders:  Orders Placed This Encounter  Procedures   XR Thoracic Spine 2 View   No orders of the defined types were placed in this encounter.     Procedures: No procedures performed   Clinical Data: No additional findings.   Subjective: No chief complaint on file.   HPI [name] is a pleasant 27 year old woman with a long history since freshman year in high school of back pain.  She denies any injuries.  The pain is focused in her thoracic spine and goes across her shoulder blades.  Describes the pain as moderate.  She was told in the past that this may be secondary to "having large breasts ".  She is has concerns  from her primary care that she may be having fibromyalgia.  She is currently on meloxicam and a muscle relaxant for about a week.  She said the muscle relaxant has not really helped maybe a little relief from the meloxicam.  Denies any paresthesias any weakness.  She did try physical therapy once but unfortunately sustained a rib fracture and had to discontinue it  Review of Systems  All other systems reviewed and are negative.    Objective: Vital Signs: There were no vitals taken for this visit.  Physical Exam Constitutional:      Appearance: Normal appearance.  Pulmonary:     Effort: Pulmonary effort is normal.  Skin:    General: Skin is warm and dry.  Neurological:     General: No focal deficit present.     Mental Status: She is alert.  Psychiatric:        Mood and Affect: Mood normal.        Behavior: Behavior normal.     Ortho Exam Examination of her low back she has no deformity.  She does have just diffuse tenderness throughout her thoracic spine and some paravertebral tightness.  She has pain with flexion extension.  Her strength with abduction triceps biceps and  grip strength are all intact sensation is intact. Specialty Comments:  No specialty comments available.  Imaging: No results found.   PMFS History: Patient Active Problem List   Diagnosis Date Noted   Thoracic back pain 05/04/2023   Past Medical History:  Diagnosis Date   Anxiety    Depression    Obsessive compulsive personality disorder (HCC)    OCD (obsessive compulsive disorder)     No family history on file.  Past Surgical History:  Procedure Laterality Date   FOOT BONE EXCISION     Social History   Occupational History   Not on file  Tobacco Use   Smoking status: Never    Passive exposure: Yes   Smokeless tobacco: Never  Substance and Sexual Activity   Alcohol use: Never   Drug use: Never   Sexual activity: Not on file

## 2023-05-25 ENCOUNTER — Encounter: Payer: Self-pay | Admitting: Physical Therapy

## 2023-05-25 ENCOUNTER — Other Ambulatory Visit: Payer: Self-pay

## 2023-05-25 ENCOUNTER — Ambulatory Visit: Payer: Self-pay | Attending: Physician Assistant | Admitting: Physical Therapy

## 2023-05-25 DIAGNOSIS — M5459 Other low back pain: Secondary | ICD-10-CM | POA: Insufficient documentation

## 2023-05-25 DIAGNOSIS — R293 Abnormal posture: Secondary | ICD-10-CM | POA: Insufficient documentation

## 2023-05-25 DIAGNOSIS — M6281 Muscle weakness (generalized): Secondary | ICD-10-CM | POA: Insufficient documentation

## 2023-05-25 DIAGNOSIS — M542 Cervicalgia: Secondary | ICD-10-CM | POA: Insufficient documentation

## 2023-05-25 DIAGNOSIS — M546 Pain in thoracic spine: Secondary | ICD-10-CM | POA: Insufficient documentation

## 2023-05-25 NOTE — Patient Instructions (Signed)
Access Code: [email or web address] URL: [email or web address] Date: 05/25/2023 Prepared by: Rosana Hoes  Exercises - Cat Cow  - 3-5 x daily - 10 reps - Quadruped Thoracic Rotation with Hand on Neck  - 3-5 x daily - 10 reps - Supine Shoulder Horizontal Abduction with Resistance  - 3 x daily - 10-15 reps - Shoulder External Rotation and Scapular Retraction with Resistance  - 3 x daily - 10-15 reps

## 2023-06-15 ENCOUNTER — Ambulatory Visit: Payer: Self-pay | Admitting: Physical Therapy

## 2023-10-10 ENCOUNTER — Other Ambulatory Visit: Payer: Self-pay | Admitting: Obstetrics and Gynecology

## 2023-10-10 ENCOUNTER — Other Ambulatory Visit (HOSPITAL_COMMUNITY)
Admission: RE | Admit: 2023-10-10 | Discharge: 2023-10-10 | Disposition: A | Discharge status: Home / Self Care | Payer: Self-pay | Source: Ambulatory Visit | Attending: Obstetrics and Gynecology | Admitting: Obstetrics and Gynecology

## 2023-10-10 DIAGNOSIS — Z124 Encounter for screening for malignant neoplasm of cervix: Secondary | ICD-10-CM | POA: Insufficient documentation

## 2023-10-12 LAB — CYTOLOGY - PAP: Diagnosis: NEGATIVE

## 2024-02-16 ENCOUNTER — Ambulatory Visit: Payer: Self-pay | Admitting: Cardiology

## 2024-04-05 ENCOUNTER — Ambulatory Visit (INDEPENDENT_AMBULATORY_CARE_PROVIDER_SITE_OTHER): Payer: Self-pay | Admitting: Obstetrics and Gynecology

## 2024-04-05 ENCOUNTER — Other Ambulatory Visit: Payer: Self-pay

## 2024-04-05 ENCOUNTER — Encounter: Payer: Self-pay | Admitting: Obstetrics and Gynecology

## 2024-04-05 VITALS — BP 139/92 | HR 81 | Ht 67.0 in | Wt 287.0 lb

## 2024-04-05 DIAGNOSIS — N941 Unspecified dyspareunia: Secondary | ICD-10-CM

## 2024-04-05 DIAGNOSIS — N939 Abnormal uterine and vaginal bleeding, unspecified: Secondary | ICD-10-CM

## 2024-04-05 DIAGNOSIS — N946 Dysmenorrhea, unspecified: Secondary | ICD-10-CM

## 2024-04-05 DIAGNOSIS — Z3202 Encounter for pregnancy test, result negative: Secondary | ICD-10-CM

## 2024-04-05 DIAGNOSIS — G8929 Other chronic pain: Secondary | ICD-10-CM

## 2024-04-05 DIAGNOSIS — N914 Secondary oligomenorrhea: Secondary | ICD-10-CM

## 2024-04-05 DIAGNOSIS — R102 Pelvic and perineal pain: Secondary | ICD-10-CM

## 2024-04-05 LAB — POCT PREGNANCY, URINE: Preg Test, Ur: NEGATIVE

## 2024-04-05 MED ORDER — NORETHINDRONE 0.35 MG PO TABS: 1.0000 | ORAL_TABLET | Freq: Every day | ORAL | 11 refills | Status: AC

## 2024-04-05 NOTE — Progress Notes (Signed)
 NEW GYNECOLOGY PATIENT Patient name: Jessica Mcclain MRN [id number]  Date of birth: 1996-01-18 Chief Complaint:   Pelvic Pain     History:  Jessica Mcclain is a 28 y.o. G0P0000 being seen today for pelvic pain including dysmenorrhea and dyspareunia. Previously instructed to take APAP priro to intercourse. Notes having irregular menses. Not actively pursuign pregnancy at this time. Menses are spaced out - earlier this year went months without a menses. Went to [place], underwent provera challenge, helped and then had psychosis and then got dismissed (which caused dismissal from all [employer] practices).  Not currently insured, and hopefully to start working next month and then get insured with Previously used IUD and pills   Discussed the use of AI scribe software for clinical note transcription with the patient, who gave verbal consent to proceed.  History of Present Illness Jessica Mcclain is a 28 year old female who presents with concerns about menstrual irregularities and associated symptoms.  She has experienced painful periods, dyspareunia, and irregular menstrual cycles since menarche at age 79. Her cycles have varied significantly, with the longest being 81 days, which she suspected might have been a miscarriage due to heavy bleeding, though this was never confirmed. Her average cycle length has been around 42 days recently, with the most recent cycle being 36 days.  Earlier this year, she underwent a Provera challenge, which successfully induced a withdrawal bleed but resulted in significant side effects, including psychosis after stopping the medication. During the Provera treatment, she noted increased energy and pupil dilation, followed by severe emotional disturbances post-treatment, including crying, laughing, and memory loss.  She has a history of menorrhagia, which led to syncope from pain at age 50. She has tried various treatments, including oral contraceptive pills  and an IUD, both of which resulted in continuous bleeding and increased pain. She has not used hormonal treatments for about ten years.  She experiences cyclical pain flares related to her fibromyalgia, constipation before her period, and diarrhea just before menstruation. A pelvic ultrasound was performed earlier this year; the patient was told it was normal and there was no discussion of concerns about the uterine lining. Hormonal levels, including FSH and progesterone, have been inconsistent, not aligning with expected phases of her menstrual cycle. She has not been actively trying to conceive.  She is currently taking extra strength ibuprofen  for pain management and avoids opioids. She has a history of elevated blood pressure, though recent visits have shown normal readings.      Gynecologic History Patient's last menstrual period was 03/01/2024 (exact date). Last Pap: February 2025 Last Mammogram: none Last Colonoscopy: n/a  Obstetric History OB History  Gravida Para Term Preterm AB Living  0 0 0 0 0 0  SAB IAB Ectopic Multiple Live Births  0 0 0 0 0    Past Medical History:  Diagnosis Date   Anxiety    Depression    Fibromyalgia    GERD (gastroesophageal reflux disease)    Obsessive compulsive personality disorder (HCC)    OCD (obsessive compulsive disorder)     Past Surgical History:  Procedure Laterality Date   FOOT BONE EXCISION      Current Outpatient Medications on File Prior to Visit  Medication Sig Dispense Refill   acetaminophen  (TYLENOL ) 325 MG tablet Take 650 mg by mouth every 6 (six) hours as needed.     albuterol (VENTOLIN HFA) 108 (90 Base) MCG/ACT inhaler Inhale 1-2 puffs into the lungs every 4 (four)  hours as needed.     cyclobenzaprine (FLEXERIL) 5 MG tablet Take 5 mg by mouth 3 (three) times daily as needed.     DULoxetine (CYMBALTA) 60 MG capsule Take 60 mg by mouth daily.     gabapentin (NEURONTIN) 300 MG capsule Take 300 mg by mouth 3 (three) times  daily.     ibuprofen  (ADVIL ) 800 MG tablet Take 800 mg by mouth every 8 (eight) hours as needed.     meloxicam (MOBIC) 7.5 MG tablet Take 7.5 mg by mouth daily as needed for pain.     pantoprazole (PROTONIX) 40 MG tablet Take 40 mg by mouth daily.     tiZANidine (ZANAFLEX) 4 MG tablet Take 4-8 mg by mouth every 8 (eight) hours as needed.     traZODone (DESYREL) 50 MG tablet Take 150 mg by mouth at bedtime.     Levonorgestrel-Ethinyl Estradiol (CAMRESE LO) 0.1-0.02 & 0.01 MG tablet Take 1 tablet by mouth at bedtime.     lidocaine  (LIDODERM ) 5 % Place 1 patch onto the skin daily. Remove & Discard patch within 12 hours or as directed by MD 15 patch 0   oxyCODONE -acetaminophen  (PERCOCET/ROXICET) 5-325 MG tablet Take 1 tablet by mouth every 4 (four) hours as needed for severe pain. 20 tablet 0   No current facility-administered medications on file prior to visit.    Allergies  Allergen Reactions   Coconut (Cocos Nucifera) Itching   Provera [Medroxyprogesterone]     psychosis    Social History:  reports that she has never smoked. She has been exposed to tobacco smoke. She has never used smokeless tobacco. She reports that she does not drink alcohol and does not use drugs.  No family history on file.  The following portions of the patient's history were reviewed and updated as appropriate: allergies, current medications, past family history, past medical history, past social history, past surgical history and problem list.  Review of Systems Pertinent items noted in HPI and remainder of comprehensive ROS otherwise negative.  Physical Exam:  BP (!) 139/92   Pulse 81   Ht 5' 7 (1.702 m)   Wt 287 lb (130.2 kg)   LMP 03/01/2024 (Exact Date)   BMI 44.95 kg/m  Physical Exam Vitals and nursing note reviewed.  Constitutional:      Appearance: Normal appearance.  Cardiovascular:     Rate and Rhythm: Normal rate.  Pulmonary:     Effort: Pulmonary effort is normal.     Breath sounds:  Normal breath sounds.  Neurological:     General: No focal deficit present.     Mental Status: She is alert and oriented to person, place, and time.  Psychiatric:        Mood and Affect: Mood normal.        Behavior: Behavior normal.        Thought Content: Thought content normal.        Judgment: Judgment normal.        Assessment and Plan:   Assessment & Plan Chronic dysmenorrhea and irregular menstrual cycles with suspected endometriosis and/or adenomyosis along with anovulation.  Emphasized regulating cycles to prevent endometrial hyperplasia and potential precancerous changes. Considered norethindrone  for hormonal regulation and therapeutic amenorrhea. Discussed side effects and goals of treatment. Discussed hormonal intervention is used to treat most conditions related to the menstrual cycle including endometriosis, adenomyosis, PCOS/anovulation, and PMDD. - Prescribe norethindrone  0.35 for hormonal regulation with close monitoring of psychological side effects - Advise to report any  adverse effects or lack of improvement.  Dyspareunia Chronic pain with intercourse likely related to suspected endometriosis and/or adenomyosis. Potential contribution from pelvic floor dysfunction. Noted that pelvic floor dysfunction diagnosed with physical examination - can be completed at follow up visit. Pelvic floor dysfunction typically a consequence of other chronic pain sources or dysfunction.  - Recommend referral to pelvic floor physical therapist for evaluation and management.  Hypertension Elevated blood pressure readings. Discussed importance of blood pressure control, especially with hormonal treatments. Avoided combination estrogen-progesterone therapy due to hypertension risk. - Advise monitoring blood pressure regularly outside of clinical settings. - Consider progesterone-only therapy due to hypertension concerns.  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 Discussed rarity and need for cautious hormonal treatment approach. Will aim for consistent hormonal levels to avoid possible recurrence. - Avoid Provera due to previous adverse reaction.  Time spent: I spent 25 minutes dedicated to the care of this patient on the date of this encounter to include pre-visit review of records, face-to-face time with the patient discussing 10, and post visit documentation and ordering testing.      Follow-up: No follow-ups on file.      Carter Quarry, MD Obstetrician & Gynecologist, [facility] [employer], [facility]

## 2024-04-06 ENCOUNTER — Other Ambulatory Visit: Payer: Self-pay | Admitting: Lactation Services

## 2024-04-06 DIAGNOSIS — R102 Pelvic and perineal pain: Secondary | ICD-10-CM

## 2024-04-06 NOTE — Progress Notes (Signed)
 Ordered Pelvic floor PT for Chronic Pelvic Pain.

## 2024-05-14 NOTE — Progress Notes (Signed)
 PPD Placement note  Jessica Mcclain, 28 y.o. female is here today for placement of PPD test  1) Reason for PPD test: [facility] Physical  2) Pt taken PPD test before: yes  3) Has the patient ever had a positive PPD test in the past? no ( If yes check with provider prior to PPD administration)  4) Verified in allergy area and with patient that they are not allergic to the products PPD is made of (Phenol or Tween). yes  5) Is patient taking any oral or IV steroid medication now or have they taken it in the last month? no  6) Has the patient ever received the BCG vaccine?: no  7) Has the patient been in recent contact with anyone known or suspected of having active TB disease?: no      Date of exposure (if applicable): N/A      Name of person they were exposed to (if applicable): N/A  8) Patient's Country of origin?: [place]   9)  Was a PPD placed on 05/14/2024.?yes  IF YES - Advise the patient to return for PPD reading within 48-72 hours. IF NO   - Have the patient fill out a TB Screening form. This should be reviewed by the provider.  10) Was a TB screening form completed? no

## 2024-05-29 ENCOUNTER — Ambulatory Visit: Payer: Self-pay | Admitting: Obstetrics and Gynecology

## 2024-06-07 ENCOUNTER — Encounter: Payer: Self-pay | Admitting: Family

## 2024-06-07 ENCOUNTER — Telehealth: Payer: Self-pay

## 2024-06-07 ENCOUNTER — Ambulatory Visit: Payer: Self-pay | Admitting: Family

## 2024-06-07 VITALS — BP 130/86 | HR 94 | Temp 98.2°F | Ht 67.0 in | Wt 292.6 lb

## 2024-06-07 DIAGNOSIS — F32A Depression, unspecified: Secondary | ICD-10-CM | POA: Insufficient documentation

## 2024-06-07 DIAGNOSIS — J329 Chronic sinusitis, unspecified: Secondary | ICD-10-CM

## 2024-06-07 DIAGNOSIS — F419 Anxiety disorder, unspecified: Secondary | ICD-10-CM | POA: Diagnosis not present

## 2024-06-07 DIAGNOSIS — B9789 Other viral agents as the cause of diseases classified elsewhere: Secondary | ICD-10-CM

## 2024-06-07 DIAGNOSIS — M797 Fibromyalgia: Secondary | ICD-10-CM | POA: Diagnosis not present

## 2024-06-07 DIAGNOSIS — K219 Gastro-esophageal reflux disease without esophagitis: Secondary | ICD-10-CM | POA: Insufficient documentation

## 2024-06-07 MED ORDER — DULOXETINE HCL 40 MG PO CPEP: 80.0000 mg | ORAL_CAPSULE | Freq: Every day | ORAL | 5 refills | Status: DC

## 2024-06-07 MED ORDER — CYCLOBENZAPRINE HCL 5 MG PO TABS: 5.0000 mg | ORAL_TABLET | Freq: Three times a day (TID) | ORAL | 2 refills | Status: DC | PRN

## 2024-06-07 MED ORDER — TRIAMCINOLONE ACETONIDE 55 MCG/ACT NA AERO: 1.0000 | INHALATION_SPRAY | Freq: Every day | NASAL | 2 refills | Status: DC

## 2024-06-07 NOTE — Assessment & Plan Note (Signed)
 Duloxetine may not fully address anxiety. Prozac was previously beneficial. Has a therapist. - Increase duloxetine to 80 mg. - Consider Prozac if duloxetine ineffective for anxiety and depression - F/U in 3 -4 mos or prn

## 2024-06-07 NOTE — Patient Instructions (Addendum)
 Welcome to [facility], It was a pleasure meeting you today!    As discussed, I have sent your refills to your pharmacy.  I also have sent in Nasacort nasal spray for your sinus symptoms. Call next week if this is not helping.  Please schedule a 4 month follow up visit today for refills.    PLEASE NOTE: If you had any LAB tests please let us  know if you have not heard back within a few days. You may see your results on MyChart before we have a chance to review them but we will give you a call once they are reviewed by us . If we ordered any REFERRALS today, please let us  know if you have not heard from their office within the next week.  Let us  know through MyChart if you are needing REFILLS, or have your pharmacy send us  the request. You can also use MyChart to communicate with me or any office staff.  Please try these tips to maintain a healthy lifestyle: It is important that you exercise regularly at least 30 minutes 5 times a week. Think about what you will eat, plan ahead. Choose whole foods, & think  clean, green, fresh or frozen over canned, processed or packaged foods which are more sugary, salty, and fatty. 70 to 75% of food eaten should be fresh vegetables and protein. 2-3  meals daily with healthy snacks between meals, but must be whole fruit, protein or vegetables. Aim to eat over a 10 hour period when you are active, for example, 7am to 5pm, and then STOP after your last meal of the day, drinking only water.  Shorter eating windows, 6-8 hours, are showing benefits in heart disease and blood sugar regulation. Drink water every day! Shoot for 64 ounces daily = 8 cups, no other drink is as healthy! Fruit juice is best enjoyed in a healthy way, by EATING the fruit.

## 2024-06-07 NOTE — Assessment & Plan Note (Signed)
 Long-term pantoprazole 40mg  qd use with no current symptoms. - Reduce pantoprazole to 20 mg, cutting 40mg  pills in half. - Refill pantoprazole at 20 mg when needed. - F/U in 6mos

## 2024-06-07 NOTE — Telephone Encounter (Signed)
 Pt seen in office today Copied from CRM [id number]. Topic: General - Running Late >> Jun 07, 2024  3:18 PM Harlene ORN wrote: Patient/patient representative is calling because they are running late for an appointment.

## 2024-06-07 NOTE — Assessment & Plan Note (Signed)
 Chronic pain managed with duloxetine, gabapentin, and cyclobenzaprine. Pain increased with sinusitis. - Increase duloxetine to 80 mg. - Refill cyclobenzaprine 5mg  tid prn. - Continue Tizanidine 4mg  in evenings prn. - F/U in 4 mos

## 2024-06-07 NOTE — Progress Notes (Signed)
 New Patient Office Visit  Subjective:  Patient ID: Christana Angelica Hosea, female    DOB: [birth year]  Age: 28 y.o. MRN: [id number]  CC:  Chief Complaint  Patient presents with   New Patient (Initial Visit)   Fibromyalgia    Pt c/o hx of fibromyalgia.    Nasal Congestion    Pt c/o nasal congestion, bilateral ear fullness and right ear pain and mucus, present for 1 week but worsened last night. Has tried mucinex and delsym, which did help slightly.    HPI Genny Caulder Hnat presents for establishing care today. Discussed the use of AI scribe software for clinical note transcription with the patient, who gave verbal consent to proceed.  History of Present Illness   Chelcie Estorga [place] Search is a 28 year old female who presents to discuss her fibromyalgia, sinus congestion and ear pain.  Sinus congestion and ear pain have persisted for two and a half weeks. Symptoms were initially severe, improved slightly, but worsened again last night. Mucus is light yellow and was bloody this morning. She uses Mucinex and Delsym for relief. No fever is present, but there is ear fullness and pain affecting hearing.  Fibromyalgia contributes to joint pain in the elbow, knee, and hip, and muscular pain in the back. Pain level is about four, increasing to seven with sinus issues. Current medications include duloxetine, gabapentin, and cyclobenzaprine.  She takes duloxetine 60 mg for anxiety and depression, trazodone for sleep, and tizanidine as needed. 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 She uses an inhaler for asthma-like symptoms triggered by smells and exercise, and takes pantoprazole 40 mg daily for stomach acid.     Assessment and Plan    Acute sinusitis Symptoms suggest sinusitis but not strongly bacterial. - Recommend OTC Flonase or Nasacort, sending RX. - Increase water intake, at least 2L qd - Continue using saline nasal spray tid prn - Consider Sudafed for congestion. -  Monitor symptoms; call if no improvement by Monday for antibiotics.  Fibromyalgia with chronic musculoskeletal pain Chronic pain managed with duloxetine, gabapentin, and cyclobenzaprine. Pain increased with sinusitis. - Increase duloxetine to 80 mg. - Refill cyclobenzaprine 5mg  tid prn. - Continue Tizanidine 4mg  in evenings prn. - F/U in 4 mos  Major depressive disorder, anxiety disorder, and obsessive-compulsive personality disorder Duloxetine may not fully address anxiety. Prozac was previously beneficial. Has a therapist. - Increase duloxetine to 80 mg. - Consider Prozac if duloxetine ineffective for anxiety and depression - F/U in 3 -4 mos or prn  Gastroesophageal reflux disease Long-term pantoprazole 40mg  qd use with no current symptoms. - Reduce pantoprazole to 20 mg, cutting 40mg  pills in half. - Refill pantoprazole at 20 mg when needed. - F/U in 6mos     Subjective:    Outpatient Medications Prior to Visit  Medication Sig Dispense Refill   acetaminophen  (TYLENOL ) 325 MG tablet Take 650 mg by mouth every 6 (six) hours as needed.     albuterol (VENTOLIN HFA) 108 (90 Base) MCG/ACT inhaler Inhale 1-2 puffs into the lungs every 4 (four) hours as needed.     clotrimazole (LOTRIMIN) 1 % cream Apply 1 Application topically 2 (two) times daily.     gabapentin (NEURONTIN) 300 MG capsule Take 300 mg by mouth 3 (three) times daily.     ibuprofen  (ADVIL ) 800 MG tablet Take 800 mg by mouth every 8 (eight) hours as needed.     norethindrone  (MICRONOR ) 0.35 MG tablet Take 1 tablet (0.35 mg total) by  mouth daily. 28 tablet 11   OVER THE COUNTER MEDICATION melatonin     OVER THE COUNTER MEDICATION magnesium     pantoprazole (PROTONIX) 20 MG tablet Take 20 mg by mouth daily.     tiZANidine (ZANAFLEX) 4 MG tablet Take 4-8 mg by mouth every 8 (eight) hours as needed.     traZODone (DESYREL) 50 MG tablet Take 150 mg by mouth at bedtime.     cyclobenzaprine (FLEXERIL) 5 MG tablet Take 5 mg by  mouth 3 (three) times daily as needed.     DULoxetine (CYMBALTA) 60 MG capsule Take 60 mg by mouth daily.     meloxicam (MOBIC) 7.5 MG tablet Take 7.5 mg by mouth daily as needed for pain.     lidocaine  (LIDODERM ) 5 % Place 1 patch onto the skin daily. Remove & Discard patch within 12 hours or as directed by MD 15 patch 0   oxyCODONE -acetaminophen  (PERCOCET/ROXICET) 5-325 MG tablet Take 1 tablet by mouth every 4 (four) hours as needed for severe pain. 20 tablet 0   No facility-administered medications prior to visit.   Past Medical History:  Diagnosis Date   Anxiety    Depression    Fibromyalgia    GERD (gastroesophageal reflux disease)    Obsessive compulsive personality disorder (HCC)    OCD (obsessive compulsive disorder)    Past Surgical History:  Procedure Laterality Date   FOOT BONE EXCISION      Objective:   Today's Vitals: BP 130/86 (BP Location: Left Arm, Patient Position: Sitting, Cuff Size: Large)   Pulse 94   Temp 98.2 F (36.8 C) (Temporal)   Ht 5' 7 (1.702 m)   Wt 292 lb 9.6 oz (132.7 kg)   LMP  (LMP Unknown)   SpO2 98%   BMI 45.83 kg/m   Physical Exam Vitals and nursing note reviewed.  Constitutional:      Appearance: Normal appearance. She is morbidly obese.  Cardiovascular:     Rate and Rhythm: Normal rate and regular rhythm.  Pulmonary:     Effort: Pulmonary effort is normal.     Breath sounds: Normal breath sounds.  Musculoskeletal:        General: Normal range of motion.  Skin:    General: Skin is warm and dry.  Neurological:     Mental Status: She is alert.  Psychiatric:        Mood and Affect: Mood normal.        Behavior: Behavior normal.     Meds ordered this encounter  Medications   DULoxetine 40 MG CPEP    Sig: Take 2 capsules (80 mg total) by mouth daily.    Dispense:  60 capsule    Refill:  5    Supervising Provider:   ANDY, CAMILLE L [[id number]]   cyclobenzaprine (FLEXERIL) 5 MG tablet    Sig: Take 1 tablet (5 mg total) by mouth  3 (three) times daily as needed.    Dispense:  90 tablet    Refill:  2    Supervising Provider:   ANDY, CAMILLE L [[id number]]   triamcinolone (NASACORT) 55 MCG/ACT AERO nasal inhaler    Sig: Place 1 spray into the nose daily. Start with 1 spray each side twice a day for 3 days, then reduce to daily.    Dispense:  1 each    Refill:  2    Supervising Provider:   ANDY, CAMILLE L [[id number]]    Lucius Krabbe, NP

## 2024-06-12 ENCOUNTER — Telehealth: Payer: Self-pay

## 2024-06-12 ENCOUNTER — Ambulatory Visit: Payer: Self-pay | Admitting: Physical Therapy

## 2024-06-12 ENCOUNTER — Other Ambulatory Visit: Payer: Self-pay

## 2024-06-12 ENCOUNTER — Ambulatory Visit: Payer: Self-pay

## 2024-06-12 DIAGNOSIS — J0110 Acute frontal sinusitis, unspecified: Secondary | ICD-10-CM

## 2024-06-12 MED ORDER — AMOXICILLIN-POT CLAVULANATE 875-125 MG PO TABS: 1.0000 | ORAL_TABLET | Freq: Two times a day (BID) | ORAL | 0 refills | Status: DC

## 2024-06-12 NOTE — Telephone Encounter (Signed)
 Rx sent per Hudnell

## 2024-06-12 NOTE — Telephone Encounter (Signed)
 yes ok for note- and I have sent the antibiotic pended in a separate message to send please!

## 2024-06-12 NOTE — Telephone Encounter (Signed)
 FYI Only or Action Required?: Action required by provider: request for documentation or forms and ABX to be called in. Pt will need a note for work.  Patient was last seen in primary care on 06/07/2024 by Lucius Krabbe, NP.  Called Nurse Triage reporting Facial Pain Sinus pain pressure. Landy /yellow sinus discharge. And chills. Sore throat  Symptoms began a week ago.  Interventions attempted: Other: Seen in office.  Symptoms are: gradually worsening.  Triage Disposition: See Physician Within 24 Hours  Patient/caregiver understands and will follow disposition?: No, refuses disposition - Pt was told at recent OV that if S/s did not resolve or if they worsened provider would call in ABX.                        Copied from CRM [id number]. Topic: Clinical - Red Word Triage >> Jun 12, 2024  8:55 AM Frederich PARAS wrote: [facility] that prompted transfer to Nurse Triage:pain  pressure in sinus, and face, . Pain I'm face.she says its worst. Mucus fatigue body pain. Throat still in pain worst then before. Head pain.  Pt was seen on 10/02 dr lucius adv if  after mon and was not better, was told to get the antiobiotic, and pt adv she does need a note for today 06/12/24. Reason for Disposition  Fever present > 3 days (72 hours)  Answer Assessment - Initial Assessment Questions 1. LOCATION: Where does it hurt?      Now behind her  on right side nose across the mask of face and up to left side of face 2. ONSET: When did the sinus pain start?  (e.g., hours, days)      Before 10/2 3. SEVERITY: How bad is the pain?   (Scale 0-10; or none, mild, moderate or severe)     Pain in sinus is pressure. Muscular pain is 8/10 4. RECURRENT SYMPTOM: Have you ever had sinus problems before? If Yes, ask: When was the last time? and What happened that time?      no 5. NASAL CONGESTION: Is the nose blocked? If Yes, ask: Can you open it or must you breathe through your mouth?     Yes - a  lot of sinus congestion 6. NASAL DISCHARGE: Do you have discharge from your nose? If so ask, What color?     Thick  - greenish yellow 7. FEVER: Do you have a fever? If Yes, ask: What is it, how was it measured, and when did it start?      Chills 8. OTHER SYMPTOMS: Do you have any other symptoms? (e.g., sore throat, cough, earache, difficulty breathing)     Fatigue. Sore throat, infrequent cough, Body aches 9. PREGNANCY: Is there any chance you are pregnant? When was your last menstrual period?     no  Protocols used: Sinus Pain or Congestion-A-AH

## 2024-06-12 NOTE — Addendum Note (Signed)
 Addended by: Chantz Montefusco on: 06/12/2024 01:24 PM   Modules accepted: Orders

## 2024-06-12 NOTE — Telephone Encounter (Signed)
 Copied from CRM #[id number]. Topic: Clinical - Medication Question >> Jun 12, 2024  4:21 PM Jessica Mcclain wrote: Reason for CRM: pt called in to confirm medication was sent to pharmacy on file. She called pharmacy and they stated they have not received the script. Please follow up if needed

## 2024-06-12 NOTE — Telephone Encounter (Signed)
 Spoke with patient and verified note needed. Informed patient note is ready for pickup at front desk and antibiotics sent in earlier per Hudnell.

## 2024-06-12 NOTE — Addendum Note (Signed)
 Addended by: Gennaro Lizotte on: 06/12/2024 02:26 PM   Modules accepted: Orders

## 2024-06-12 NOTE — Telephone Encounter (Signed)
 Pt called back in and asked fir an update from nurse. Please call back before eod.

## 2024-06-12 NOTE — Telephone Encounter (Signed)
 Copied from CRM [id number]. Topic: General - Other >> Jun 12, 2024  8:58 AM Frederich PARAS wrote: Reason for CRM:Pt was seen on 10/02 dr lucius adv if  after mon and was not better, was told to get the antiobiotic, and pt adv she does need a note for today 06/12/24.Pt says she have gotten worst. Pt needs a note for missed work today. Pt callback # is [phone]

## 2024-06-12 NOTE — Addendum Note (Signed)
 Addended by: Jamesia Linnen on: 06/12/2024 02:37 PM   Modules accepted: Orders

## 2024-06-12 NOTE — Telephone Encounter (Signed)
 I have since resent the Augmentin script to the pharmacy

## 2024-06-12 NOTE — Therapy (Incomplete)
 OUTPATIENT PHYSICAL THERAPY FEMALE PELVIC EVALUATION   Patient Name: Jessica Mcclain MRN: [id number] DOB:[birth year], 28 y.o., female Today's Date: 06/12/2024  END OF SESSION:   Past Medical History:  Diagnosis Date   Anxiety    Depression    Fibromyalgia    GERD (gastroesophageal reflux disease)    Obsessive compulsive personality disorder (HCC)    OCD (obsessive compulsive disorder)    Past Surgical History:  Procedure Laterality Date   FOOT BONE EXCISION     Patient Active Problem List   Diagnosis Date Noted   Fibromyalgia 06/07/2024   Anxiety and depression 06/07/2024   Gastroesophageal reflux disease without esophagitis 06/07/2024   Thoracic back pain 05/04/2023   Acute viral conjunctivitis of left eye 03/18/2016   Eczema 11/28/2015    PCP: Lucius Krabbe, NP  REFERRING PROVIDER: Jeralyn Crutch, MD  REFERRING DIAG: R10.2,G89.29 (ICD-10-CM) - Chronic pelvic pain in female   THERAPY DIAG:  No diagnosis found.  Rationale for Evaluation and Treatment: Rehabilitation  ONSET DATE: ***  SUBJECTIVE:                                                                                                                                                                                           SUBJECTIVE STATEMENT: *** Fluid intake:   FUNCTIONAL LIMITATIONS: ***  PERTINENT HISTORY:  Medications for current condition: *** Surgeries: *** Other: *** Sexual abuse: {Yes/No:304960894}  DIAGNOSTIC FINDINGS:  Post-void residual: Voiding Cystourethrogram (VCUG):  Ultrasound: PAIN:  Are you having pain? {yes/no:20286} NPRS scale: ***/10 Pain location: {pelvic pain location:27098}  Pain type: {type:313116} Pain description: {PAIN DESCRIPTION:21022940}   Aggravating factors: *** Relieving factors: ***  PRECAUTIONS: {Therapy precautions:24002}  RED FLAGS: {PT Red Flags:29287}   WEIGHT BEARING RESTRICTIONS: {Yes ***/No:24003}  FALLS:  Has patient  fallen in last 6 months? {fallsyesno:27318}  OCCUPATION: ***  ACTIVITY LEVEL : ***  PLOF: {PLOF:24004}  PATIENT GOALS: ***   BOWEL MOVEMENT: Pain with bowel movement: {yes/no:20286} Type of bowel movement:{PT BM type:27100} Fully empty rectum: {No/Yes:304960894} Leakage: {Yes/No:304960894}                                                     Caused by: *** Pads: {Yes/No:304960894} Fiber supplement/laxative {YES/NO AS:20300}  URINATION: Pain with urination: {yes/no:20286} Fully empty bladder: {Yes/No:304960894}***                                Post-void dribble: {  YES/NO AS:20300} Stream: {PT urination:27102} Urgency: {YES/NO AS:20300} Frequency:during the day ***                                                         Nocturia: {Yes/No:304960894}***   Leakage: {PT leakage:27103} Pads/briefs: {Yes/No:304960894}  INTERCOURSE:  Ability to have vaginal penetration {YES/NO:21197} Pain with intercourse: {pain with intercourse PA:27099} Dryness: {YES/NO AS:20300} Climax: *** Marinoff Scale: ***/3 Lubricant:  PREGNANCY: Vaginal deliveries *** Tearing {Yes***/No:304960894} Episiotomy {YES/NO AS:20300} C-section deliveries *** Currently pregnant {Yes***/No:304960894}  PROLAPSE: {PT prolapse:27101}   OBJECTIVE:  Note: Objective measures were completed at Evaluation unless otherwise noted.  DIAGNOSTIC FINDINGS:  ***  PATIENT SURVEYS:  {rehab surveys:24030}  PFIQ-7: ***  COGNITION: Overall cognitive status: {cognition:24006}     SENSATION: Light touch: {intact/deficits:24005}  LUMBAR SPECIAL TESTS:  {lumbar special test:25242}  FUNCTIONAL TESTS:  {Functional tests:24029} Single leg stance:  Rt:  Lt: Sit-up test: Squat: Bed mobility:  GAIT: Assistive device utilized: {Assistive devices:23999} Comments: ***  POSTURE: {posture:25561}   LUMBARAROM/PROM:  A/PROM A/PROM  Eval (% available)  Flexion   Extension   Right lateral flexion   Left  lateral flexion   Right rotation   Left rotation    (Blank rows = not tested)  LOWER EXTREMITY ROM:  {AROM/PROM:27142} ROM Right eval Left eval  Hip flexion    Hip extension    Hip abduction    Hip adduction    Hip internal rotation    Hip external rotation    Knee flexion    Knee extension    Ankle dorsiflexion    Ankle plantarflexion    Ankle inversion    Ankle eversion     (Blank rows = not tested)  LOWER EXTREMITY MMT:  MMT Right eval Left eval  Hip flexion    Hip extension    Hip abduction    Hip adduction    Hip internal rotation    Hip external rotation    Knee flexion    Knee extension    Ankle dorsiflexion    Ankle plantarflexion    Ankle inversion    Ankle eversion     (Blank rows = not tested) PALPATION:  General: ***  Pelvic Alignment: ***  Abdominal: ***  Diastasis: {Yes/No:304960894}*** Distortion: {YES/NO AS:20300}  Breathing: *** Scar tissue: {Yes/No:304960894}***                External Perineal Exam: ***                             Internal Pelvic Floor: ***  Patient confirms identification and approves PT to assess internal pelvic floor and treatment {yes/no:20286}  PELVIC MMT:   MMT eval  Vaginal   Internal Anal Sphincter   External Anal Sphincter   Puborectalis   Diastasis Recti   (Blank rows = not tested)        TONE: ***  PROLAPSE: ***  TODAY'S TREATMENT:  DATE: ***  EVAL ***   PATIENT EDUCATION:  Education details: *** Person educated: {Person educated:25204} Education method: {Education Method:25205} Education comprehension: {Education Comprehension:25206}  HOME EXERCISE PROGRAM: ***  ASSESSMENT:  CLINICAL IMPRESSION: Patient is a *** y.o. *** who was seen today for physical therapy evaluation and treatment for ***.   OBJECTIVE IMPAIRMENTS: {opptimpairments:25111}.    ACTIVITY LIMITATIONS: {activitylimitations:27494}  PARTICIPATION LIMITATIONS: {participationrestrictions:25113}  PERSONAL FACTORS: {Personal factors:25162} are also affecting patient's functional outcome.   REHAB POTENTIAL: {rehabpotential:25112}  CLINICAL DECISION MAKING: {clinical decision making:25114}  EVALUATION COMPLEXITY: {Evaluation complexity:25115}   GOALS: Goals reviewed with patient? {yes/no:20286}  SHORT TERM GOALS: Target date: ***  *** Baseline: Goal status: INITIAL  2.  *** Baseline:  Goal status: INITIAL  3.  *** Baseline:  Goal status: INITIAL  4.  *** Baseline:  Goal status: INITIAL  5.  *** Baseline:  Goal status: INITIAL  6.  *** Baseline:  Goal status: INITIAL  LONG TERM GOALS: Target date: ***  *** Baseline:  Goal status: INITIAL  2.  *** Baseline:  Goal status: INITIAL  3.  *** Baseline:  Goal status: INITIAL  4.  *** Baseline:  Goal status: INITIAL  5.  *** Baseline:  Goal status: INITIAL  6.  *** Baseline:  Goal status: INITIAL  PLAN:  PT FREQUENCY: {rehab frequency:25116}  PT DURATION: {rehab duration:25117}  PLANNED INTERVENTIONS: {rehab planned interventions:25118::97110-Therapeutic exercises,97530- Therapeutic [phone]- Neuromuscular re-education,97535- Self Rjmz,02859- Manual therapy,Patient/Family education}  PLAN FOR NEXT SESSION: ***   Kessa Fairbairn, PT 06/12/2024, 7:43 AM

## 2024-06-26 ENCOUNTER — Encounter: Payer: Self-pay | Admitting: Emergency Medicine

## 2024-06-26 ENCOUNTER — Ambulatory Visit: Payer: Self-pay

## 2024-06-26 ENCOUNTER — Ambulatory Visit (INDEPENDENT_AMBULATORY_CARE_PROVIDER_SITE_OTHER): Admitting: Emergency Medicine

## 2024-06-26 VITALS — BP 130/70 | HR 109 | Temp 98.6°F | Wt 293.2 lb

## 2024-06-26 DIAGNOSIS — B9689 Other specified bacterial agents as the cause of diseases classified elsewhere: Secondary | ICD-10-CM | POA: Diagnosis not present

## 2024-06-26 DIAGNOSIS — J329 Chronic sinusitis, unspecified: Secondary | ICD-10-CM | POA: Diagnosis not present

## 2024-06-26 MED ORDER — AMOXICILLIN-POT CLAVULANATE 875-125 MG PO TABS: 1.0000 | ORAL_TABLET | Freq: Two times a day (BID) | ORAL | 0 refills | Status: AC

## 2024-06-26 NOTE — Telephone Encounter (Signed)
 Patient calling with continued concerns for productive cough with yellowish-green sputum, nasal congestion, sore throat, body pain and fatigue. Patient reports finishing her antibiotics this past Thursday. Reports still not feeling well. Patient requesting an appointment today and latest appointment possible. Patient is scheduled for 4 PM appointment today at alternative location. Patient given information of clinic and verbalized understanding.  FYI Only or Action Required?: FYI only for provider.  Patient was last seen in primary care on 06/07/2024 by Lucius Krabbe, NP.  Called Nurse Triage reporting Cough.  Symptoms began several days ago.  Interventions attempted: Rest, hydration, or home remedies.  Symptoms are: unchanged.  Triage Disposition: See HCP Within 4 Hours (Or PCP Triage)  Patient/caregiver understands and will follow disposition?: Yes  Copied from CRM #[id number]. Topic: Clinical - Red Word Triage >> Jun 26, 2024  8:36 AM Rea BROCKS wrote: Red Word that prompted transfer to Nurse Triage: Congestion, nose, cough, sore throat, severe body pain, and fatigue. Patient finished antibiotics last Thursday and still dealing with all the symptoms listed. Reason for Disposition  [1] MILD difficulty breathing (e.g., minimal/no SOB at rest, SOB with walking, pulse < 100) AND [2] still present when not coughing  (Exception: No change from usual, chronic shortness of breath.)  Answer Assessment - Initial Assessment Questions 1. ONSET: When did the cough begin?      Started 05/21/2024 2. SEVERITY: How bad is the cough today?      moderate 3. SPUTUM: Describe the color of your sputum (e.g., none, dry cough; clear, white, yellow, green)     Yellowish-green 4. HEMOPTYSIS: Are you coughing up any blood? If so ask: How much? (e.g., flecks, streaks, tablespoons, etc.)     no 5. DIFFICULTY BREATHING: Are you having difficulty breathing? If Yes, ask: How bad is it? (e.g., mild,  moderate, severe)      Mild-moderate 6. FEVER: Do you have a fever? If Yes, ask: What is your temperature, how was it measured, and when did it start?     Patient doesn't have a thermometer to check her temperature but doesn't think she has run a temp 7. CARDIAC HISTORY: Do you have any history of heart disease? (e.g., heart attack, congestive heart failure)      no 8. LUNG HISTORY: Do you have any history of lung disease?  (e.g., pulmonary embolus, asthma, emphysema)     no 9. PE RISK FACTORS: Do you have a history of blood clots? (or: recent major surgery, recent prolonged travel, bedridden)     no 10. OTHER SYMPTOMS: Do you have any other symptoms? (e.g., runny nose, wheezing, chest pain)       Nasal congestion, sore throat, body pain, fatigue 11. PREGNANCY: Is there any chance you are pregnant? When was your last menstrual period?       no 12. TRAVEL: Have you traveled out of the country in the last month? (e.g., travel history, exposures)       no  Protocols used: Cough - Chronic-A-AH

## 2024-06-26 NOTE — Progress Notes (Signed)
 Jessica Mcclain 28 y.o.   Chief Complaint  Patient presents with   Cough    Cough and congestion that's been going on since Sept. She was treated but her symptoms returned when she finished the antibiotic.    HISTORY OF PRESENT ILLNESS: This is a 28 y.o. female complaining of sore throat, cough, sinus congestion and pressure that started on September 15 Was treated for bacterial sinusitis with Augmentin for 10 days.  Felt better for about 4 days after finishing but symptoms came back 2 days ago No other complaints or medical concerns today.  Cough Associated symptoms include headaches. Pertinent negatives include no chest pain, chills, fever or shortness of breath.     Prior to Admission medications   Medication Sig Start Date End Date Taking? Authorizing Provider  acetaminophen  (TYLENOL ) 325 MG tablet Take 650 mg by mouth every 6 (six) hours as needed.   Yes [provider]  albuterol (VENTOLIN HFA) 108 (90 Base) MCG/ACT inhaler Inhale 1-2 puffs into the lungs every 4 (four) hours as needed. 01/10/24  Yes [provider]  clotrimazole (LOTRIMIN) 1 % cream Apply 1 Application topically 2 (two) times daily.   Yes [provider]  cyclobenzaprine (FLEXERIL) 5 MG tablet Take 1 tablet (5 mg total) by mouth 3 (three) times daily as needed. 06/07/24  Yes Lucius Krabbe, NP  DULoxetine 40 MG CPEP Take 2 capsules (80 mg total) by mouth daily. 06/07/24  Yes Lucius Krabbe, NP  gabapentin (NEURONTIN) 300 MG capsule Take 300 mg by mouth 3 (three) times daily. 03/15/24  Yes [provider]  ibuprofen  (ADVIL ) 800 MG tablet Take 800 mg by mouth every 8 (eight) hours as needed. 02/29/24  Yes [provider]  norethindrone  (MICRONOR ) 0.35 MG tablet Take 1 tablet (0.35 mg total) by mouth daily. 04/05/24  Yes Ajewole, Christana, MD  OVER THE COUNTER MEDICATION melatonin   Yes [provider]  OVER THE COUNTER MEDICATION magnesium   Yes  [provider]  pantoprazole (PROTONIX) 20 MG tablet Take 20 mg by mouth daily. 12/11/23  Yes [provider]  tiZANidine (ZANAFLEX) 4 MG tablet Take 4-8 mg by mouth every 8 (eight) hours as needed. 02/09/24  Yes [provider]  traZODone (DESYREL) 50 MG tablet Take 150 mg by mouth at bedtime. 02/29/24  Yes [provider]  triamcinolone (NASACORT) 55 MCG/ACT AERO nasal inhaler Place 1 spray into the nose daily. Start with 1 spray each side twice a day for 3 days, then reduce to daily. 06/07/24  Yes Lucius Krabbe, NP  amoxicillin-clavulanate (AUGMENTIN) 875-125 MG tablet Take 1 tablet by mouth 2 (two) times daily after a meal. Patient not taking: Reported on 06/26/2024 06/12/24   Lucius Krabbe, NP    Allergies  Allergen Reactions   Coconut (Cocos Nucifera) Itching   Provera [Medroxyprogesterone]     psychosis    Patient Active Problem List   Diagnosis Date Noted   Fibromyalgia 06/07/2024   Anxiety and depression 06/07/2024   Gastroesophageal reflux disease without esophagitis 06/07/2024   Eczema 11/28/2015    Past Medical History:  Diagnosis Date   Anxiety    Depression    Fibromyalgia    GERD (gastroesophageal reflux disease)    Obsessive compulsive personality disorder (HCC)    OCD (obsessive compulsive disorder)     Past Surgical History:  Procedure Laterality Date   FOOT BONE EXCISION      Social History   Socioeconomic History   Marital status: Single  Spouse name: Not on file   Number of children: Not on file   Years of education: Not on file   Highest education level: Not on file  Occupational History   Not on file  Tobacco Use   Smoking status: Never    Passive exposure: Yes   Smokeless tobacco: Never  Substance and Sexual Activity   Alcohol use: Never   Drug use: Never   Sexual activity: Not on file  Other Topics Concern   Not on file  Social History Narrative   Not on file   Social Drivers of Health    Financial Resource Strain: Not on file  Food Insecurity: Not on file  Transportation Needs: Not on file  Physical Activity: Not on file  Stress: Not on file  Social Connections: Not on file  Intimate Partner Violence: Not on file    No family history on file.   Review of Systems  Constitutional: Negative.  Negative for chills and fever.  HENT:  Positive for congestion.   Respiratory:  Positive for cough. Negative for shortness of breath.   Cardiovascular: Negative.  Negative for chest pain and palpitations.  Gastrointestinal:  Negative for abdominal pain, nausea and vomiting.  Genitourinary: Negative.  Negative for dysuria.  Neurological:  Positive for headaches.  All other systems reviewed and are negative.   Vitals:   06/26/24 1606  BP: 130/70  Pulse: (!) 109  Temp: 98.6 F (37 C)  SpO2: 97%    Physical Exam Vitals reviewed.  Constitutional:      Appearance: Normal appearance.  HENT:     Head: Normocephalic.     Right Ear: Tympanic membrane, ear canal and external ear normal.     Left Ear: Tympanic membrane, ear canal and external ear normal.     Nose: Congestion present.     Mouth/Throat:     Mouth: Mucous membranes are moist.     Pharynx: Oropharynx is clear.  Eyes:     Extraocular Movements: Extraocular movements intact.     Pupils: Pupils are equal, round, and reactive to light.  Cardiovascular:     Rate and Rhythm: Normal rate and regular rhythm.     Pulses: Normal pulses.     Heart sounds: Normal heart sounds.  Pulmonary:     Effort: Pulmonary effort is normal.     Breath sounds: Normal breath sounds.  Musculoskeletal:     Cervical back: No tenderness.  Lymphadenopathy:     Cervical: No cervical adenopathy.  Skin:    General: Skin is warm and dry.  Neurological:     General: No focal deficit present.     Mental Status: She is alert and oriented to person, place, and time.  Psychiatric:        Mood and Affect: Mood normal.        Behavior:  Behavior normal.      ASSESSMENT & PLAN: Problem List Items Addressed This Visit       Respiratory   Bacterial sinusitis - Primary   Partially treated bacterial sinusitis Clinically stable but still very symptomatic Recommend to restart Augmentin 875 mg twice a day for 10 days Symptom management discussed May use Flonase nasal spray Recommend frequent use of saline nasal sprays during the day Nettie pot recommended Tylenol  and/or Advil  as needed for sinus headaches Advised to follow-up with PCP if no better or worse during the next several days      Relevant Medications   amoxicillin-clavulanate (AUGMENTIN) 875-125 MG tablet  Patient Instructions  Sinus Infection, Adult A sinus infection is soreness and swelling (inflammation) of your sinuses. Sinuses are hollow spaces in the bones around your face. They are located: Around your eyes. In the middle of your forehead. Behind your nose. In your cheekbones. Your sinuses and nasal passages are lined with a fluid called mucus. Mucus drains out of your sinuses. Swelling can trap mucus in your sinuses. This lets germs (bacteria, virus, or fungus) grow, which leads to infection. Most of the time, this condition is caused by a virus. What are the causes? Allergies. Asthma. Germs. Things that block your nose or sinuses. Growths in the nose (nasal polyps). Chemicals or irritants in the air. A fungus. This is rare. What increases the risk? Having a weak body defense system (immune system). Doing a lot of swimming or diving. Using nasal sprays too much. Smoking. What are the signs or symptoms? The main symptoms of this condition are pain and a feeling of pressure around the sinuses. Other symptoms include: Stuffy nose (congestion). This may make it hard to breathe through your nose. Runny nose (drainage). Soreness, swelling, and warmth in the sinuses. A cough that may get worse at night. Being unable to smell and taste. Mucus  that collects in the throat or the back of the nose (postnasal drip). This may cause a sore throat or bad breath. Being very tired (fatigued). A fever. How is this diagnosed? Your symptoms. Your medical history. A physical exam. Tests to find out if your condition is short-term (acute) or long-term (chronic). Your doctor may: Check your nose for growths (polyps). Check your sinuses using a tool that has a light on one end (endoscope). Check for allergies or germs. Do imaging tests, such as an MRI or CT scan. How is this treated? Treatment for this condition depends on the cause and whether it is short-term or long-term. If caused by a virus, your symptoms should go away on their own within 10 days. You may be given medicines to relieve symptoms. They include: Medicines that shrink swollen tissue in the nose. A spray that treats swelling of the nostrils. Rinses that help get rid of thick mucus in your nose (nasal saline washes). Medicines that treat allergies (antihistamines). Over-the-counter pain relievers. If caused by bacteria, your doctor may wait to see if you will get better without treatment. You may be given antibiotic medicine if you have: A very bad infection. A weak body defense system. If caused by growths in the nose, surgery may be needed. Follow these instructions at home: Medicines Take, use, or apply over-the-counter and prescription medicines only as told by your doctor. These may include nasal sprays. If you were prescribed an antibiotic medicine, take it as told by your doctor. Do not stop taking it even if you start to feel better. Hydrate and humidify  Drink enough water to keep your pee (urine) pale yellow. Use a cool mist humidifier to keep the humidity level in your home above 50%. Breathe in steam for 10-15 minutes, 3-4 times a day, or as told by your doctor. You can do this in the bathroom while a hot shower is running. Try not to spend time in cool or dry  air. Rest Rest as much as you can. Sleep with your head raised (elevated). Make sure you get enough sleep each night. General instructions  Put a warm, moist washcloth on your face 3-4 times a day, or as often as told by your doctor. Use nasal saline  washes as often as told by your doctor. Wash your hands often with soap and water. If you cannot use soap and water, use hand sanitizer. Do not smoke. Avoid being around people who are smoking (secondhand smoke). Keep all follow-up visits. Contact a doctor if: You have a fever. Your symptoms get worse. Your symptoms do not get better within 10 days. Get help right away if: You have a very bad headache. You cannot stop vomiting. You have very bad pain or swelling around your face or eyes. You have trouble seeing. You feel confused. Your neck is stiff. You have trouble breathing. These symptoms may be an emergency. Get help right away. Call 911. Do not wait to see if the symptoms will go away. Do not drive yourself to the hospital. Summary A sinus infection is swelling of your sinuses. Sinuses are hollow spaces in the bones around your face. This condition is caused by tissues in your nose that become inflamed or swollen. This traps germs. These can lead to infection. If you were prescribed an antibiotic medicine, take it as told by your doctor. Do not stop taking it even if you start to feel better. Keep all follow-up visits. This information is not intended to replace advice given to you by your health care provider. Make sure you discuss any questions you have with your health care provider. Document Revised: 07/28/2021 Document Reviewed: 07/28/2021 Elsevier Patient Education  2024 [employer]    Emil Schaumann, MD [facility]

## 2024-06-26 NOTE — Telephone Encounter (Signed)
 Appt today

## 2024-06-26 NOTE — Patient Instructions (Signed)

## 2024-06-26 NOTE — Assessment & Plan Note (Signed)
 Partially treated bacterial sinusitis Clinically stable but still very symptomatic Recommend to restart Augmentin 875 mg twice a day for 10 days Symptom management discussed May use Flonase nasal spray Recommend frequent use of saline nasal sprays during the day Nettie pot recommended Tylenol  and/or Advil  as needed for sinus headaches Advised to follow-up with PCP if no better or worse during the next several days

## 2024-06-27 ENCOUNTER — Encounter: Payer: Self-pay | Admitting: Obstetrics and Gynecology

## 2024-06-27 ENCOUNTER — Telehealth (INDEPENDENT_AMBULATORY_CARE_PROVIDER_SITE_OTHER): Payer: Self-pay | Admitting: Obstetrics and Gynecology

## 2024-06-27 DIAGNOSIS — N946 Dysmenorrhea, unspecified: Secondary | ICD-10-CM

## 2024-06-27 DIAGNOSIS — N941 Unspecified dyspareunia: Secondary | ICD-10-CM | POA: Diagnosis not present

## 2024-06-27 DIAGNOSIS — N914 Secondary oligomenorrhea: Secondary | ICD-10-CM | POA: Diagnosis not present

## 2024-06-27 DIAGNOSIS — N939 Abnormal uterine and vaginal bleeding, unspecified: Secondary | ICD-10-CM

## 2024-06-27 NOTE — Progress Notes (Unsigned)
 GYNECOLOGY VIRTUAL VISIT ENCOUNTER NOTE  Provider location: Center for [facility]   Patient location: Home  I connected with Jessica Mcclain on 06/27/24 at  4:15 PM EDT by MyChart Video Encounter and verified that I am speaking with the correct person using two identifiers.   I discussed the limitations, risks, security and privacy concerns of performing an evaluation and management service virtually and the availability of in person appointments. I also discussed with the patient that there may be a patient responsible charge related to this service. The patient expressed understanding and agreed to proceed.   History:  Jessica Mcclain is a 28 y.o. G0P0000 female being evaluated today for follow up . Had about 3 day cycle that was medium flow and very painful with micronor . Monthly around time of menses will stil have pain.  Not able to get PFPT due to feeling sick, so has been rescheduled Mentally has gone well. Reviewe dho. Finishing 3rd pack now and picking up third pack and overall feeling ok. PT rescheduled for November. Pain is lighter without the period  Has started working; not having the bleeding has been helpful in eleaviating the stress.       Past Medical History:  Diagnosis Date   Anxiety    Depression    Fibromyalgia    GERD (gastroesophageal reflux disease)    Obsessive compulsive personality disorder (HCC)    OCD (obsessive compulsive disorder)    Past Surgical History:  Procedure Laterality Date   FOOT BONE EXCISION     The following portions of the patient's history were reviewed and updated as appropriate: allergies, current medications, past family history, past medical history, past social history, past surgical history and problem list.   Health Maintenance:  10/10/2023 NILM  Review of Systems:  Pertinent items noted in HPI and remainder of comprehensive ROS otherwise negative.  Physical Exam:   General:   Alert, oriented and cooperative. Patient appears to be in no acute distress.  Mental Status: Normal mood and affect. Normal behavior. Normal judgment and thought content.   Respiratory: Normal respiratory effort, no problems with respiration noted  Rest of physical exam deferred due to type of encounter  Labs and Imaging    Assessment and Plan:     1. Abnormal uterine bleeding (AUB) (Primary) 2. Dysmenorrhea 3. Dyspareunia in female 4. Secondary oligomenorrhea Reviewed typical progression of endometrial growth and shedding and intended purpose of continuous progestin therapy. Assessment of endometrial lining is via US  or direct visualization. US  ordered for Nov/Dec, if thickened lining on US  recommend endometrial sampling with either in office EMB or hysteroscopy. Continue micronor  for now. PFPT scheduled. Will follow up response to PFPT after US .   - US  PELVIC COMPLETE WITH TRANSVAGINAL; Future       I discussed the assessment and treatment plan with the patient. The patient was provided an opportunity to ask questions and all were answered. The patient agreed with the plan and demonstrated an understanding of the instructions.   The patient was advised to call back or seek an in-person evaluation/go to the ED if the symptoms worsen or if the condition fails to improve as anticipated.  I provided 5 minutes of face-to-face time during this encounter. I also spent 5 minutes dedicated to the care of this patient including pre-visit review of records, post visit ordering of medications and appropriate tests or procedures, coordinating care and documenting this visit encounter.    Taejah Ohalloran,  MD [facility] for [employer], [facility]

## 2024-06-30 ENCOUNTER — Ambulatory Visit (HOSPITAL_COMMUNITY)

## 2024-07-02 ENCOUNTER — Other Ambulatory Visit: Payer: Self-pay | Admitting: Family

## 2024-07-02 ENCOUNTER — Other Ambulatory Visit: Payer: Self-pay

## 2024-07-02 ENCOUNTER — Encounter: Payer: Self-pay | Admitting: Family

## 2024-07-02 DIAGNOSIS — M797 Fibromyalgia: Secondary | ICD-10-CM

## 2024-07-02 DIAGNOSIS — K219 Gastro-esophageal reflux disease without esophagitis: Secondary | ICD-10-CM

## 2024-07-02 DIAGNOSIS — G4700 Insomnia, unspecified: Secondary | ICD-10-CM

## 2024-07-02 MED ORDER — GABAPENTIN 300 MG PO CAPS: 300.0000 mg | ORAL_CAPSULE | Freq: Three times a day (TID) | ORAL | 0 refills | Status: AC

## 2024-07-02 MED ORDER — TIZANIDINE HCL 4 MG PO TABS: 4.0000 mg | ORAL_TABLET | Freq: Three times a day (TID) | ORAL | 2 refills | Status: DC | PRN

## 2024-07-02 MED ORDER — IBUPROFEN 800 MG PO TABS: 800.0000 mg | ORAL_TABLET | Freq: Three times a day (TID) | ORAL | 0 refills | Status: DC | PRN

## 2024-07-02 MED ORDER — TRAZODONE HCL 50 MG PO TABS: ORAL_TABLET | ORAL | 1 refills | Status: DC

## 2024-07-02 MED ORDER — CYCLOBENZAPRINE HCL 5 MG PO TABS: 5.0000 mg | ORAL_TABLET | Freq: Three times a day (TID) | ORAL | 2 refills | Status: DC | PRN

## 2024-07-02 MED ORDER — PANTOPRAZOLE SODIUM 20 MG PO TBEC: 20.0000 mg | DELAYED_RELEASE_TABLET | Freq: Every day | ORAL | 0 refills | Status: DC

## 2024-07-02 NOTE — Telephone Encounter (Signed)
 all meds ok, but she should not be taking Ibuprofen  every day, only prn - this can increase risk of stomach ulcers and kidney damage. Prefer she just use 600mg  bid prn. All meds for Fibromyalge, Trazodone for insomnia and pantoprazole for GERD. thx

## 2024-07-04 NOTE — Telephone Encounter (Signed)
 I had suggested she double her Cymbalta dose, has she tried this, can take a couple of weeks to notice a difference. The other option would be to start Meloxicam which is a slow release anti-inflammatory that is less likely to cause stomach or kidney issues in the short term. Let me know, thx

## 2024-07-05 ENCOUNTER — Other Ambulatory Visit: Payer: Self-pay

## 2024-07-05 DIAGNOSIS — M797 Fibromyalgia: Secondary | ICD-10-CM

## 2024-07-05 MED ORDER — MELOXICAM 15 MG PO TABS: 15.0000 mg | ORAL_TABLET | Freq: Every day | ORAL | 5 refills | Status: DC

## 2024-07-05 NOTE — Telephone Encounter (Signed)
 yes, ok to send refill of 15mg  if she wants this, 30pills with 5 refills. thx

## 2024-07-06 ENCOUNTER — Encounter: Payer: Self-pay | Admitting: Internal Medicine

## 2024-07-09 ENCOUNTER — Encounter: Payer: Self-pay | Admitting: Emergency Medicine

## 2024-07-10 NOTE — Telephone Encounter (Signed)
**Note De-identified  [place] Obfuscation** Please advise 

## 2024-07-10 NOTE — Telephone Encounter (Signed)
 Recommend to follow-up with her PCP

## 2024-07-11 ENCOUNTER — Encounter: Payer: Self-pay | Admitting: Family

## 2024-07-12 ENCOUNTER — Other Ambulatory Visit: Payer: Self-pay

## 2024-07-12 DIAGNOSIS — M797 Fibromyalgia: Secondary | ICD-10-CM

## 2024-07-12 MED ORDER — DULOXETINE HCL 40 MG PO CPEP: 40.0000 mg | ORAL_CAPSULE | Freq: Every day | ORAL | 0 refills | Status: DC

## 2024-07-12 NOTE — Telephone Encounter (Signed)
 yes, but if lasts another week, she should stop and schedule an OV to discuss.

## 2024-07-16 ENCOUNTER — Encounter: Payer: Self-pay | Admitting: Obstetrics and Gynecology

## 2024-07-16 NOTE — Therapy (Incomplete)
 OUTPATIENT PHYSICAL THERAPY FEMALE PELVIC EVALUATION   Patient Name: Jessica Mcclain MRN: [id number] DOB:07-23-1996, 28 y.o., female Today's Date: 07/16/2024  END OF SESSION:   Past Medical History:  Diagnosis Date   Anxiety    Depression    Fibromyalgia    GERD (gastroesophageal reflux disease)    Obsessive compulsive personality disorder (HCC)    OCD (obsessive compulsive disorder)    Past Surgical History:  Procedure Laterality Date   FOOT BONE EXCISION     Patient Active Problem List   Diagnosis Date Noted   Bacterial sinusitis 06/26/2024   Fibromyalgia 06/07/2024   Anxiety and depression 06/07/2024   Gastroesophageal reflux disease without esophagitis 06/07/2024   Eczema 11/28/2015    PCP: Lucius Krabbe, NP  REFERRING PROVIDER: Jeralyn Crutch, MD  REFERRING DIAG: R10.20,G89.29 (ICD-10-CM) - Chronic pelvic pain in female  THERAPY DIAG:  No diagnosis found.  Rationale for Evaluation and Treatment: {HABREHAB:27488}  ONSET DATE: ***  SUBJECTIVE:                                                                                                                                                                                           SUBJECTIVE STATEMENT: *** Fluid intake:   FUNCTIONAL LIMITATIONS: ***  PERTINENT HISTORY:  Medications for current condition: *** Surgeries: *** Other: *** Sexual abuse: {Yes/No:304960894}  PAIN:  Are you having pain? {yes/no:20286} NPRS scale: ***/10 Pain location: {pelvic pain location:27098}  Pain type: {type:313116} Pain description: {PAIN DESCRIPTION:21022940}   Aggravating factors: *** Relieving factors: ***  PRECAUTIONS: {Therapy precautions:24002}  RED FLAGS: {PT Red Flags:29287}   WEIGHT BEARING RESTRICTIONS: {Yes ***/No:24003}  FALLS:  Has patient fallen in last 6 months? {fallsyesno:27318}  OCCUPATION: ***  ACTIVITY LEVEL : ***  PLOF: {PLOF:24004}  PATIENT GOALS: ***   BOWEL  MOVEMENT: Pain with bowel movement: {yes/no:20286} Type of bowel movement:{PT BM type:27100} Fully empty rectum: {No/Yes:304960894} Leakage: {Yes/No:304960894}                                                  Caused by: *** Bowel urgency: *** Pads: {Yes/No:304960894} Fiber supplement/laxative {YES/NO AS:20300}  URINATION: Pain with urination: {yes/no:20286} Fully empty bladder: {Yes/No:304960894}***                                         Post-void dribble: {YES/NO AS:20300} Stream: {PT urination:27102} Urgency: {YES/NO AS:20300} Frequency:during the day ***  Nocturia: {Yes/No:304960894}***   Leakage: {PT leakage:27103} Pads/briefs: {Yes/No:304960894}  INTERCOURSE:  Ability to have vaginal penetration {YES/NO:21197} Pain with intercourse: {pain with intercourse PA:27099} Dryness: {YES/NO AS:20300} Climax: *** Marinoff Scale: ***/3 Lubricant:  PREGNANCY: Vaginal deliveries *** Tearing {Yes***/No:304960894} Episiotomy {YES/NO AS:20300} C-section deliveries *** Currently pregnant {Yes***/No:304960894}  PROLAPSE: {PT prolapse:27101}   OBJECTIVE:  Note: Objective measures were completed at Evaluation unless otherwise noted.  DIAGNOSTIC FINDINGS:  Post-void residual: Voiding Cystourethrogram (VCUG):  Ultrasound: ***  PATIENT SURVEYS:  {rehab surveys:24030}  PFIQ-7: *** UIQ-7 *** CRAIG -7 *** POPIQ-7 *** Female Sexual Function Index (FSFI) Questionnaire ***  COGNITION: Overall cognitive status: {cognition:24006}     SENSATION: Light touch: {intact/deficits:24005}  LUMBAR SPECIAL TESTS:  {lumbar special test:25242}  FUNCTIONAL TESTS:  {Functional tests:24029} Single leg stance:  Rt:  Lt: Sit-up test: Squat: Bed mobility:  GAIT: Assistive device utilized: {Assistive devices:23999} Comments: ***  POSTURE: {posture:25561}   LUMBARAROM/PROM:  A/PROM A/PROM  Eval (% available)  Flexion    Extension   Right lateral flexion   Left lateral flexion   Right rotation   Left rotation    (Blank rows = not tested)  LOWER EXTREMITY ROM:  {AROM/PROM:27142} ROM Right eval Left eval  Hip flexion    Hip extension    Hip abduction    Hip adduction    Hip internal rotation    Hip external rotation    Knee flexion    Knee extension    Ankle dorsiflexion    Ankle plantarflexion    Ankle inversion    Ankle eversion     (Blank rows = not tested)  LOWER EXTREMITY MMT:  MMT Right eval Left eval  Hip flexion    Hip extension    Hip abduction    Hip adduction    Hip internal rotation    Hip external rotation    Knee flexion    Knee extension    Ankle dorsiflexion    Ankle plantarflexion    Ankle inversion    Ankle eversion     (Blank rows = not tested) PALPATION:  General: ***  Pelvic Alignment: ***  Abdominal: ***  Diastasis: {Yes/No:304960894}*** Distortion: {YES/NO AS:20300}  Breathing: *** Scar tissue: {Yes/No:304960894}*** Active Straight Leg Raise: ***                External Perineal Exam: ***                             Internal Pelvic Floor: ***  Patient confirms identification and approves PT to assess internal pelvic floor and treatment {yes/no:20286} All internal or external pelvic floor assessments and/or treatments are completed with proper hand hygiene and gloves hands. If needed gloves are changed with hand hygiene during patient care time.  PELVIC MMT:   MMT eval  Vaginal   Internal Anal Sphincter   External Anal Sphincter   Puborectalis   (Blank rows = not tested)        TONE: ***  PROLAPSE: ***  TODAY'S TREATMENT:  DATE: ***  EVAL ***   PATIENT EDUCATION:  Education details: *** Person educated: {Person educated:25204} Education method: {Education Method:25205} Education comprehension:  {Education Comprehension:25206}  HOME EXERCISE PROGRAM: ***  ASSESSMENT:  CLINICAL IMPRESSION: Patient is a *** y.o. *** who was seen today for physical therapy evaluation and treatment for ***.   OBJECTIVE IMPAIRMENTS: {opptimpairments:25111}.   ACTIVITY LIMITATIONS: {activitylimitations:27494}  PARTICIPATION LIMITATIONS: {participationrestrictions:25113}  PERSONAL FACTORS: {Personal factors:25162} are also affecting patient's functional outcome.   REHAB POTENTIAL: {rehabpotential:25112}  CLINICAL DECISION MAKING: {clinical decision making:25114}  EVALUATION COMPLEXITY: {Evaluation complexity:25115}   GOALS: Goals reviewed with patient? {yes/no:20286}  SHORT TERM GOALS: Target date: ***  *** Baseline: Goal status: INITIAL  2.  *** Baseline:  Goal status: INITIAL  3.  *** Baseline:  Goal status: INITIAL  4.  *** Baseline:  Goal status: INITIAL  5.  *** Baseline:  Goal status: INITIAL  6.  *** Baseline:  Goal status: INITIAL  LONG TERM GOALS: Target date: ***  *** Baseline:  Goal status: INITIAL  2.  *** Baseline:  Goal status: INITIAL  3.  *** Baseline:  Goal status: INITIAL  4.  *** Baseline:  Goal status: INITIAL  5.  *** Baseline:  Goal status: INITIAL  6.  *** Baseline:  Goal status: INITIAL  PLAN:  PT FREQUENCY: {rehab frequency:25116}  PT DURATION: {rehab duration:25117}  PLANNED INTERVENTIONS: {rehab planned interventions:25118::97110-Therapeutic exercises,97530- Therapeutic [phone]- Neuromuscular re-education,97535- Self Rjmz,02859- Manual therapy,Patient/Family education}  PLAN FOR NEXT SESSION: ***   Kriss Perleberg, PT 07/16/2024, 4:56 PM

## 2024-07-17 ENCOUNTER — Ambulatory Visit: Admitting: Physical Therapy

## 2024-07-23 ENCOUNTER — Telehealth: Payer: Self-pay

## 2024-07-23 NOTE — Telephone Encounter (Signed)
 Copied from CRM [id number]. Topic: General - Other >> Jul 23, 2024 11:20 AM Tinnie C wrote: Reason for CRM: Patient is wanting to know if she needs to come in to get fmla done for her fibromyalgia. She says she has talked about her condition in great detail with Corean, but has never mentioned fmla before with her. She says if an appt is needed, can it be virtual? I scheduled her for an in person visit in December, but she could possibly do earlier if virtual since she has a difficult December schedule. Requesting call back at [phone]  Virtual is fine.

## 2024-07-24 ENCOUNTER — Other Ambulatory Visit: Payer: Self-pay | Admitting: Family

## 2024-07-24 DIAGNOSIS — G4700 Insomnia, unspecified: Secondary | ICD-10-CM

## 2024-08-01 ENCOUNTER — Ambulatory Visit: Admitting: Physical Therapy

## 2024-08-06 ENCOUNTER — Telehealth: Admitting: Family

## 2024-08-06 ENCOUNTER — Encounter: Payer: Self-pay | Admitting: Family

## 2024-08-06 VITALS — Ht 67.0 in

## 2024-08-06 DIAGNOSIS — M797 Fibromyalgia: Secondary | ICD-10-CM | POA: Diagnosis not present

## 2024-08-06 DIAGNOSIS — K219 Gastro-esophageal reflux disease without esophagitis: Secondary | ICD-10-CM

## 2024-08-06 MED ORDER — CELECOXIB 100 MG PO CAPS: 100.0000 mg | ORAL_CAPSULE | Freq: Two times a day (BID) | ORAL | 2 refills | Status: DC

## 2024-08-06 MED ORDER — PANTOPRAZOLE SODIUM 20 MG PO TBEC: 20.0000 mg | DELAYED_RELEASE_TABLET | Freq: Two times a day (BID) | ORAL | 1 refills | Status: AC

## 2024-08-06 NOTE — Progress Notes (Signed)
 MyChart Video Visit    Virtual Visit via Video Note   This format is felt to be most appropriate for this patient at this time. Physical exam was limited by quality of the video and audio technology used for the visit. CMA was able to get the patient set up on a video visit.  Patient location: Home. Patient and provider in visit Provider location: Office  I discussed the limitations of evaluation and management by telemedicine and the availability of in person appointments. The patient expressed understanding and agreed to proceed.  Visit Date: 08/06/2024  Today's healthcare provider: Lucius Krabbe, NP     Subjective:   Patient ID: Jessica Mcclain, female    DOB: 1995/10/05, 28 y.o.   MRN: [id number]  Chief Complaint  Patient presents with   Fibromyalgia    Discuss FMLA   HPI: Fibromyalgia: She is needing FMLA paperwork completed due to her condition. She reports pain in her joints involving the elbow, knee, and hip, and muscular pain in the back. Pain level is about four, increasing to 7-8 towards end of day. Current medications include duloxetine , gabapentin , Meloxicam  and cyclobenzaprine . She reports the Meloxicam  is not working as well as the Ibuprofen  (switched due to concern for side effects r/t daily doses of Ibuprofen ).  She also takes duloxetine  60 mg for anxiety and depression, trazodone  for sleep, and tizanidine  as needed. 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 She sees therapist every other week and PT for pelvic therapy to start soon - and wants to reestablish her chiropractic care which was beneficial in the past.  ASSESSMENT & PLAN:  1. Fibromyalgia (Primary) FMLA paperwork completed, signed, and left up front for pt pick up. Copy scanned to EMR. Starting Celebrex bid with Protonix . Advised to f/u in 2 months, need lab  work.  - celecoxib (CELEBREX) 100 MG capsule; Take 1 capsule (100 mg total) by mouth 2 (two) times daily.  Dispense: 60 capsule; Refill: 2  2. Gastroesophageal reflux disease without esophagitis Increasing dose back to 40mg  due to need for daily NSAIDs. Advised to take 20mg  bid with Celebrex.  - pantoprazole  (PROTONIX ) 20 MG tablet; Take 1 tablet (20 mg total) by mouth 2 (two) times daily. Take with Celecoxib.  Dispense: 180 tablet; Refill: 1 - celecoxib (CELEBREX) 100 MG capsule; Take 1 capsule (100 mg total) by mouth 2 (two) times daily.  Dispense: 60 capsule; Refill: 2   Past Medical History:  Diagnosis Date   Anxiety    Depression    Fibromyalgia    GERD (gastroesophageal reflux disease)    Obsessive compulsive personality disorder (HCC)    OCD (obsessive compulsive disorder)    Past Surgical History:  Procedure Laterality Date   FOOT BONE EXCISION      Outpatient Medications Prior to Visit  Medication Sig Dispense Refill   acetaminophen  (TYLENOL ) 325 MG tablet Take 650 mg by mouth every 6 (six) hours as needed.     albuterol (VENTOLIN HFA) 108 (90 Base) MCG/ACT inhaler Inhale 1-2 puffs into the lungs every 4 (four) hours as needed.     clotrimazole (LOTRIMIN) 1 % cream Apply 1 Application topically 2 (two) times daily.     cyclobenzaprine  (FLEXERIL ) 5 MG tablet Take 1 tablet (5 mg total) by mouth 3 (three) times daily as needed. 90 tablet 2  DULoxetine  HCl 40 MG CPEP Take 1 capsule (40 mg total) by mouth daily. 90 capsule 0   gabapentin  (NEURONTIN ) 300 MG capsule Take 1 capsule (300 mg total) by mouth 3 (three) times daily. 270 capsule 0   ibuprofen  (ADVIL ) 800 MG tablet Take 1 tablet (800 mg total) by mouth every 8 (eight) hours as needed. 90 tablet 0   meloxicam  (MOBIC ) 15 MG tablet Take 1 tablet (15 mg total) by mouth daily. 30 tablet 5   norethindrone  (MICRONOR ) 0.35 MG tablet Take 1 tablet (0.35 mg total) by mouth daily. 28 tablet 11   OVER THE COUNTER MEDICATION  melatonin     OVER THE COUNTER MEDICATION magnesium     pantoprazole  (PROTONIX ) 20 MG tablet Take 1 tablet (20 mg total) by mouth daily. 90 tablet 0   tiZANidine  (ZANAFLEX ) 4 MG tablet Take 1-2 tablets (4-8 mg total) by mouth every 8 (eight) hours as needed. 30 tablet 2   traZODone  (DESYREL ) 50 MG tablet TAKE 1 UP TO 3 TABLETS BY MOUTH AT BEDTIME AS NEEDED 270 tablet 2   triamcinolone  (NASACORT ) 55 MCG/ACT AERO nasal inhaler Place 1 spray into the nose daily. Start with 1 spray each side twice a day for 3 days, then reduce to daily. 1 each 2   No facility-administered medications prior to visit.    Allergies  Allergen Reactions   Coconut (Cocos Nucifera) Itching   Provera [Medroxyprogesterone]     psychosis      Objective:   Physical Exam Vitals and nursing note reviewed.  Constitutional:      General: Pt is not in acute distress.    Appearance: Normal appearance.  HENT:     Head: Normocephalic.  Pulmonary:     Effort: No respiratory distress.  Musculoskeletal:     Cervical back: Normal range of motion.  Skin:    General: Skin is dry.     Coloration: Skin is not pale.  Neurological:     Mental Status: Pt is alert and oriented to person, place, and time.  Psychiatric:        Mood and Affect: Mood normal.   There were no vitals taken for this visit.  Wt Readings from Last 3 Encounters:  06/26/24 293 lb 3.2 oz (133 kg)  06/07/24 292 lb 9.6 oz (132.7 kg)  04/05/24 287 lb (130.2 kg)      I discussed the assessment and treatment plan with the patient. The patient was provided an opportunity to ask questions and all were answered. The patient agreed with the plan and demonstrated an understanding of the instructions.   The patient was advised to call back or seek an in-person evaluation if the symptoms worsen or if the condition fails to improve as anticipated.  *I personally spent a total of 32 minutes in the care of the patient today including preparing to see the  patient, counseling and educating, placing orders, documenting clinical information in the EHR, and completing FMLA paperwork.   Lucius Krabbe, NP [facility] [phone] (phone) [phone] (fax)  [facility]

## 2024-08-08 ENCOUNTER — Encounter: Payer: Self-pay | Admitting: Family

## 2024-08-08 DIAGNOSIS — M797 Fibromyalgia: Secondary | ICD-10-CM

## 2024-08-09 NOTE — Telephone Encounter (Signed)
 yes, ok to refer, thanks

## 2024-08-14 ENCOUNTER — Ambulatory Visit: Admitting: Family

## 2024-08-20 ENCOUNTER — Ambulatory Visit: Payer: Self-pay | Admitting: Internal Medicine

## 2024-09-12 ENCOUNTER — Encounter: Payer: Self-pay | Admitting: Obstetrics and Gynecology

## 2024-09-14 ENCOUNTER — Encounter: Payer: Self-pay | Admitting: Physical Medicine & Rehabilitation

## 2024-09-24 ENCOUNTER — Ambulatory Visit: Admitting: Family

## 2024-09-25 ENCOUNTER — Emergency Department (HOSPITAL_COMMUNITY)
Admission: EM | Admit: 2024-09-25 | Discharge: 2024-09-25 | Disposition: A | Discharge status: Home / Self Care | Attending: Emergency Medicine | Admitting: Emergency Medicine

## 2024-09-25 ENCOUNTER — Ambulatory Visit: Payer: Self-pay

## 2024-09-25 ENCOUNTER — Other Ambulatory Visit: Payer: Self-pay

## 2024-09-25 ENCOUNTER — Encounter (HOSPITAL_COMMUNITY): Payer: Self-pay

## 2024-09-25 DIAGNOSIS — J101 Influenza due to other identified influenza virus with other respiratory manifestations: Secondary | ICD-10-CM | POA: Diagnosis not present

## 2024-09-25 DIAGNOSIS — R059 Cough, unspecified: Secondary | ICD-10-CM | POA: Diagnosis present

## 2024-09-25 LAB — RESP PANEL BY RT-PCR (RSV, FLU A&B, COVID)  RVPGX2
Influenza A by PCR: POSITIVE — AB
Influenza B by PCR: NEGATIVE
Resp Syncytial Virus by PCR: NEGATIVE
SARS Coronavirus 2 by RT PCR: NEGATIVE

## 2024-09-25 LAB — PREGNANCY, URINE: Preg Test, Ur: NEGATIVE

## 2024-09-25 MED ORDER — SODIUM CHLORIDE 0.9 % IV BOLUS
1000.0000 mL | Freq: Once | INTRAVENOUS | Status: AC
  Administered 2024-09-25: 1000 mL via INTRAVENOUS

## 2024-09-25 MED ORDER — ONDANSETRON HCL 4 MG PO TABS: 4.0000 mg | ORAL_TABLET | Freq: Three times a day (TID) | ORAL | 0 refills | Status: AC | PRN

## 2024-09-25 MED ORDER — ACETAMINOPHEN 325 MG PO TABS
650.0000 mg | ORAL_TABLET | Freq: Once | ORAL | Status: AC
  Administered 2024-09-25: 650 mg via ORAL
  Filled 2024-09-25: qty 2

## 2024-09-25 MED ORDER — ONDANSETRON 4 MG PO TBDP
4.0000 mg | ORAL_TABLET | Freq: Once | ORAL | Status: AC
  Administered 2024-09-25: 4 mg via ORAL
  Filled 2024-09-25: qty 1

## 2024-09-25 NOTE — ED Provider Notes (Signed)
 " [facility] Provider Note   CSN: [id number] Arrival date & time: 09/25/24  1437     Patient presents with: No chief complaint on file.   Jessica Mcclain is a 29 y.o. female.  With a history of fibromyalgia who presents to the ED for viral syndrome.  Viral symptoms of cough congestion shortness of breath sinus pressure headaches body aches nausea and vomiting began 2 days ago persistent since the onset.  Has been taking over-the-counter cold medications.  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 Authorizing Provider  ondansetron  (ZOFRAN ) 4 MG tablet Take 1 tablet (4 mg total) by mouth every 8 (eight) hours as needed for up to 4 days for nausea or vomiting. 09/25/24 09/29/24 Yes Pamella Ozell LABOR, DO  acetaminophen  (TYLENOL ) 325 MG tablet Take 650 mg by mouth every 6 (six) hours as needed.    [provider]  albuterol (VENTOLIN HFA) 108 (90 Base) MCG/ACT inhaler Inhale 1-2 puffs into the lungs every 4 (four) hours as needed. 01/10/24   [provider]  celecoxib  (CELEBREX ) 100 MG capsule Take 1 capsule (100 mg total) by mouth 2 (two) times daily. 08/06/24   Lucius Krabbe, NP  clotrimazole  (LOTRIMIN ) 1 % cream Apply 1 Application topically 2 (two) times daily.    [provider]  cyclobenzaprine  (FLEXERIL ) 5 MG tablet Take 1 tablet (5 mg total) by mouth 3 (three) times daily as needed. 07/02/24   Lucius Krabbe, NP  DULoxetine  HCl 40 MG CPEP Take 1 capsule (40 mg total) by mouth daily. 07/12/24   Lucius Krabbe, NP  gabapentin  (NEURONTIN ) 300 MG capsule Take 1 capsule (300 mg total) by mouth 3 (three) times daily. 07/02/24   Lucius Krabbe, NP  norethindrone  (MICRONOR ) 0.35 MG tablet Take 1 tablet (0.35 mg total) by mouth daily. 04/05/24   Ajewole, Christana, MD  OVER THE COUNTER MEDICATION melatonin    [provider]  OVER THE COUNTER MEDICATION magnesium    [provider]  pantoprazole  (PROTONIX ) 20 MG tablet Take 1 tablet (20 mg total) by mouth 2 (two) times daily. Take with Celecoxib . 08/06/24   Lucius Krabbe, NP  tiZANidine  (ZANAFLEX ) 4 MG tablet Take 1-2 tablets (4-8 mg total) by mouth every 8 (eight) hours as needed. 07/02/24   Lucius Krabbe, NP  traZODone  (DESYREL ) 50 MG tablet TAKE 1 UP TO 3 TABLETS BY MOUTH AT BEDTIME AS NEEDED 07/24/24   Lucius Krabbe, NP  triamcinolone  (NASACORT ) 55 MCG/ACT AERO nasal inhaler Place 1 spray into the nose daily. Start with 1 spray each side twice a day for 3 days, then reduce to daily. 06/07/24   Lucius Krabbe, NP    Allergies: Coconut (cocos nucifera) and Provera [medroxyprogesterone]    Review of Systems  Updated Vital Signs BP (!) 138/99   Pulse (!) 113   Temp 99.4 F (37.4 C) (Oral)   Resp 20   Ht 5' 7 (1.702 m)   Wt 133.8 kg   LMP 09/10/2024   SpO2 100%   BMI 46.20 kg/m   Physical Exam Vitals and nursing note reviewed.  HENT:     Head: Normocephalic and atraumatic.  Eyes:     Pupils: Pupils are equal, round, and reactive to light.  Cardiovascular:     Rate and Rhythm: Normal rate and regular rhythm.  Pulmonary:  Effort: Pulmonary effort is normal.     Breath sounds: Normal breath sounds.  Abdominal:     Palpations: Abdomen is soft.     Tenderness: There is no abdominal tenderness.  Skin:    General: Skin is warm and dry.  Neurological:     Mental Status: She is alert.  Psychiatric:        Mood and Affect: Mood normal.     (all labs ordered are listed, but only abnormal results are displayed) Labs Reviewed  RESP PANEL BY RT-PCR (RSV, FLU A&B, COVID)  [id number] - Abnormal; Notable for the following components:      Result Value   Influenza A by PCR POSITIVE (*)    All other components within normal limits  PREGNANCY, URINE    EKG: None  Radiology: No results found.   Procedures    Medications Ordered in the ED  ondansetron  (ZOFRAN -ODT) disintegrating tablet 4 mg (4 mg Oral Given 09/25/24 1551)  acetaminophen  (TYLENOL ) tablet 650 mg (650 mg Oral Given 09/25/24 1552)  sodium chloride  0.9 % bolus 1,000 mL (1,000 mLs Intravenous New Bag/Given 09/25/24 1811)                                    Medical Decision Making 29 year old female with history as above presenting for viral syndrome.  Voices concern for dehydration in the setting of vomiting for the last 2 days.  Influenza A positive.  Pregnancy negative.  Gave her Zofran  and IV fluids for rehydration here and she is feeling a little better.  Will discharge with Zofran  and instruction on symptomatic management of flu symptoms.  Amount and/or Complexity of Data Reviewed Labs: ordered.        Final diagnoses:  Influenza A    ED Discharge Orders          Ordered    ondansetron  (ZOFRAN ) 4 MG tablet  Every 8 hours PRN        09/25/24 1956               Pamella Ozell LABOR, DO 09/25/24 1956  "

## 2024-09-25 NOTE — Discharge Instructions (Signed)
 You were seen in the emerged ferment for flu symptoms You tested positive for influenza A We have called in a prescription for Zofran  to help with nausea and vomiting Keep well-hydrated take Tylenol  Motrin  Return for trouble breathing

## 2024-09-25 NOTE — Telephone Encounter (Signed)
 FYI Only or Action Required?: FYI only for provider: ED advised.  Patient was last seen in primary care on 08/06/2024 by Lucius Krabbe, NP.  Called Nurse Triage reporting Fever and Vomiting.  Symptoms began several days ago.  Interventions attempted: Nothing.  Symptoms are: rapidly worsening.  Triage Disposition: Go to ED Now (or PCP Triage)  Patient/caregiver understands and will follow disposition?: Yes     Message from [place] G sent at 09/25/2024  9:55 AM EST  Reason for Triage: Pt is not feeling well. Symptoms: chest congestion, fever, sinus pressure, headache, alot of joint and muscular pain, hard to breathe, cough, no appetite. not able to drink, and dizziness.   Reason for Disposition  [1] Drinking very little AND [2] dehydration suspected (e.g., no urine > 12 hours, very dry mouth, very lightheaded)  Answer Assessment - Initial Assessment Questions Pt called to report worsening congestion, h/a,vomiting and muscle/joint pain with dizziness and weakness. Pt reports fever symptoms with significant chills but no thermometer and unable to check temp at this time. Pt states she has not ate since 09/23/24 and has only been able to keep down sips of fluids; no electrolytes. Pt is reporting severe leg cramps, h/a, dizziness and head rush when standing up. Pt is still producing urine but reports severe dry mouth, visual floaters but denies that this is new for her. Based on symptoms, advised IV hydration at ED. Pt agreeable, husband is working until 2pm but pt states she can call her mother to take her to ED. Discussed based on symptoms, pt should have someone else drive. PT voiced appreciation. Pt had medication f/u appt for tomorrow, 01/21 and requested it be rescheduled for next week d/t symptoms today. Appt resch'd.      1. VOMITING SEVERITY: How many times have you vomited in the past 24 hours?      Pt is unable to eat/ drink without emesis   2. ONSET: When did the  vomiting begin?      Worsening since Sunday, 09/23/24  3. FLUIDS: What fluids or food have you vomited up today? Have you been able to keep any fluids down?     Pt has not ate since Sunday and no fluids other than sips since last night   8. HYDRATION STATUS: Any signs of dehydration? (e.g., dry mouth [not only dry lips], too weak to stand) When did you last urinate?     Severe signs of dehydration; dry mouth, weakness with dizziness, leg cramps  9. OTHER SYMPTOMS: Do you have any other symptoms? (e.g., fever, headache, vertigo, vomiting blood or coffee grounds, recent head injury)     Fever, congestion, sinus pressure, h/a, muscle/joint pain  Protocols used: Vomiting-A-AH

## 2024-09-25 NOTE — Telephone Encounter (Signed)
 Reviewed

## 2024-09-25 NOTE — ED Triage Notes (Signed)
 Pt c/o chest congestion, SOB, nasal congestion, sinus pressure, dizziness, HA, productive cough, bodyaches, N/Vx2d.

## 2024-09-26 ENCOUNTER — Ambulatory Visit: Admitting: Family

## 2024-09-26 ENCOUNTER — Other Ambulatory Visit: Payer: Self-pay | Admitting: Family

## 2024-09-26 ENCOUNTER — Encounter: Payer: Self-pay | Admitting: Family

## 2024-09-26 DIAGNOSIS — M797 Fibromyalgia: Secondary | ICD-10-CM

## 2024-09-26 DIAGNOSIS — B369 Superficial mycosis, unspecified: Secondary | ICD-10-CM

## 2024-09-27 MED ORDER — CLOTRIMAZOLE-BETAMETHASONE 1-0.05 % EX CREA: 1.0000 | TOPICAL_CREAM | Freq: Every day | CUTANEOUS | 0 refills | Status: AC

## 2024-09-27 NOTE — Telephone Encounter (Signed)
 looks like you already filled the tizanidine ..I have also sent in the cream, needs to keep her appt on Wednesday!

## 2024-10-01 ENCOUNTER — Ambulatory Visit: Admitting: Family

## 2024-10-03 ENCOUNTER — Encounter: Payer: Self-pay | Admitting: Family

## 2024-10-03 ENCOUNTER — Ambulatory Visit: Admitting: Family

## 2024-10-03 VITALS — BP 124/86 | HR 99 | Temp 97.7°F | Resp 14 | Ht 68.0 in | Wt 292.2 lb

## 2024-10-03 DIAGNOSIS — M797 Fibromyalgia: Secondary | ICD-10-CM

## 2024-10-03 DIAGNOSIS — F32A Depression, unspecified: Secondary | ICD-10-CM

## 2024-10-03 DIAGNOSIS — K219 Gastro-esophageal reflux disease without esophagitis: Secondary | ICD-10-CM | POA: Diagnosis not present

## 2024-10-03 DIAGNOSIS — F419 Anxiety disorder, unspecified: Secondary | ICD-10-CM | POA: Diagnosis not present

## 2024-10-03 MED ORDER — DULOXETINE HCL 40 MG PO CPEP: 80.0000 mg | ORAL_CAPSULE | Freq: Every day | ORAL | 1 refills | Status: AC

## 2024-10-03 MED ORDER — CYCLOBENZAPRINE HCL 10 MG PO TABS: 10.0000 mg | ORAL_TABLET | Freq: Two times a day (BID) | ORAL | 1 refills | Status: AC

## 2024-10-03 MED ORDER — TIZANIDINE HCL 4 MG PO TABS: 4.0000 mg | ORAL_TABLET | Freq: Every day | ORAL | 1 refills | Status: AC

## 2024-10-03 MED ORDER — CELECOXIB 100 MG PO CAPS: 100.0000 mg | ORAL_CAPSULE | Freq: Two times a day (BID) | ORAL | 1 refills | Status: AC

## 2024-10-03 NOTE — Progress Notes (Signed)
 "  Patient ID: Jessica Mcclain, female    DOB: 1995-11-19, 29 y.o.   MRN: [id number]  Chief Complaint  Patient presents with   Fibromyalgia   Breast Problem    Pt c/o boil under right breast, tenderness and redness in bilateral breast.   Discussed the use of AI scribe software for clinical note transcription with the patient, who gave verbal consent to proceed.  History of Present Illness Jessica Mcclain is a 29 year old female with fibromyalgia who presents with concerns about medication costs and breast symptoms.  She reports a recent sharp increase in duloxetine  cost, now about twenty-five dollars for a two-week supply instead of five to six dollars per month. She takes two 40 mg capsules once daily for fibromyalgia pain and did not notice significant improvement after the dose increase. She stopped Celebrex  during a recent flu and noticed increased baseline pain. She plans to restart it. She takes cyclobenzaprine  10 mg twice daily without drowsiness and tizanidine  at night, which causes sedation. She has recurring bumps under her breast that she squeezes. One recently drained. She is worried about associated spider veins and spotty purple discoloration in the area. 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 She asks about possible Ehlers-Danlos syndrome and notes prior autoimmune testing, including ANA, was normal.  Assessment & Plan Fibromyalgia Chronic pain management challenges taking duloxetine  80mg  qd and celecoxib  100mg  bid, Flexeril  10mg  bid & Tizanidine  4mg  qhs. Only mild improvement with increased duloxetine  dose noted yet. Cyclobenzaprine  taken at higher dose without drowsiness. Advised Cymbalta  dose is at max for depression/anxiety, above recommended dose for Fibromyalgia. - Continue  duloxetine  80mg  qd, sending refill. - Resume Celebrex  for pain management, sending refill for 90days. - Adjust cyclobenzaprine  10 mg bid. - Continue Tizanidine  4mg  qhs. - F/U in 4 months  Recurrent skin boils under right breast Boil drained, no inflammation currently noted, small opening with no drainage. No antibiotics needed today. Hygiene measures advised to prevent recurrence. - Use Hibiclens or similar antibacterial soap in areas prone to boils daily in shower with gently scrubbing to exfoliate the skin. - Monitor for signs of inflammation in future, tenderness & redness and can send antibiotic if noted.  General Health Maintenance Discussed flu vaccination benefits despite potential fibromyalgia symptom exacerbation. Discussed mammogram need due to PCB exposure at [facility]. - Consider flu vaccination, especially if planning to travel or during flu season. - Gather documentation of PCB exposure at [facility] for potential mammogram coverage.  Subjective:    Outpatient Medications Prior to Visit  Medication Sig Dispense Refill   acetaminophen  (TYLENOL ) 325 MG tablet Take 650 mg by mouth every 6 (six) hours as needed.     albuterol (VENTOLIN HFA) 108 (90 Base) MCG/ACT inhaler Inhale 1-2 puffs into the lungs every 4 (four) hours as needed.     celecoxib  (CELEBREX ) 100 MG capsule Take 1 capsule (100 mg total) by mouth 2 (two) times daily. 60 capsule 2   clotrimazole  (LOTRIMIN ) 1 % cream Apply 1 Application topically 2 (two) times daily.     clotrimazole -betamethasone  (LOTRISONE ) cream Apply 1 Application topically daily. 30 g 0   cyclobenzaprine  (FLEXERIL ) 5 MG tablet Take 1 tablet (5 mg total) by mouth 3 (three) times daily as needed. 90 tablet 2   DULoxetine  HCl 40  MG CPEP Take 1 capsule (40 mg total) by mouth daily. 90 capsule 0   gabapentin  (NEURONTIN ) 300 MG capsule Take 1 capsule (300 mg total) by mouth 3 (three) times daily. 270 capsule 0   norethindrone  (MICRONOR ) 0.35 MG tablet  Take 1 tablet (0.35 mg total) by mouth daily. 28 tablet 11   pantoprazole  (PROTONIX ) 20 MG tablet Take 1 tablet (20 mg total) by mouth 2 (two) times daily. Take with Celecoxib . 180 tablet 1   tiZANidine  (ZANAFLEX ) 4 MG tablet TAKE 1 OR 2 TABLETS BY MOUTH UP TO EVERY 8 HOURS AS NEEDED. 30 tablet 2   traZODone  (DESYREL ) 50 MG tablet TAKE 1 UP TO 3 TABLETS BY MOUTH AT BEDTIME AS NEEDED 270 tablet 2   OVER THE COUNTER MEDICATION melatonin (Patient not taking: Reported on 10/03/2024)     OVER THE COUNTER MEDICATION magnesium (Patient not taking: Reported on 10/03/2024)     triamcinolone  (NASACORT ) 55 MCG/ACT AERO nasal inhaler Place 1 spray into the nose daily. Start with 1 spray each side twice a day for 3 days, then reduce to daily. (Patient not taking: Reported on 10/03/2024) 1 each 2   No facility-administered medications prior to visit.   Past Medical History:  Diagnosis Date   Anxiety    Depression    Fibromyalgia    GERD (gastroesophageal reflux disease)    Obsessive compulsive personality disorder (HCC)    OCD (obsessive compulsive disorder)    Past Surgical History:  Procedure Laterality Date   FOOT BONE EXCISION     Allergies[1]    Objective:    Physical Exam Vitals and nursing note reviewed.  Constitutional:      Appearance: Normal appearance. She is morbidly obese.  Cardiovascular:     Rate and Rhythm: Normal rate and regular rhythm.  Pulmonary:     Effort: Pulmonary effort is normal.     Breath sounds: Normal breath sounds.  Musculoskeletal:        General: Normal range of motion.  Skin:    General: Skin is warm and dry.     Findings: Abscess (under right breast, approx. 1.5cm in diameter, small opening noted, no drainage, erythema or swelling noted) present.  Neurological:     Mental Status: She is alert.  Psychiatric:        Mood and Affect: Mood normal.        Behavior: Behavior normal.    BP 124/86   Pulse 99   Temp 97.7 F (36.5 C) (Temporal)   Resp 14    Ht 5' 8 (1.727 m)   Wt 292 lb 3.2 oz (132.5 kg)   LMP 09/10/2024   SpO2 96%   BMI 44.43 kg/m  Wt Readings from Last 3 Encounters:  10/03/24 292 lb 3.2 oz (132.5 kg)  09/25/24 295 lb (133.8 kg)  06/26/24 293 lb 3.2 oz (133 kg)      Lucius Krabbe, NP     [1]  Allergies Allergen Reactions   Coconut (Cocos Nucifera) Itching   Provera [Medroxyprogesterone]     psychosis   "

## 2024-10-22 ENCOUNTER — Encounter: Admitting: Physical Medicine & Rehabilitation
